# Patient Record
Sex: Female | Born: 1960 | Race: White | Hispanic: No | Marital: Married | Smoking: Never smoker
Health system: Southern US, Community
[De-identification: ages and names within clinical notes are randomized; demographics above are authoritative.]

## PROBLEM LIST (undated history)

## (undated) DIAGNOSIS — I10 Essential (primary) hypertension: Secondary | ICD-10-CM

## (undated) DIAGNOSIS — O24419 Gestational diabetes mellitus in pregnancy, unspecified control: Secondary | ICD-10-CM

## (undated) HISTORY — PX: CHOLECYSTECTOMY: SHX55

## (undated) HISTORY — DX: Essential (primary) hypertension: I10

## (undated) HISTORY — DX: Morbid (severe) obesity due to excess calories: E66.01

## (undated) HISTORY — DX: Gestational diabetes mellitus in pregnancy, unspecified control: O24.419

## (undated) HISTORY — PX: TOTAL ABDOMINAL HYSTERECTOMY: SHX209

## (undated) HISTORY — PX: ANTERIOR (CYSTOCELE) AND POSTERIOR REPAIR (RECTOCELE) WITH XENFORM GRAFT AND SACROSPINOUS FIXATION: SHX6492

---

## 2019-10-04 ENCOUNTER — Ambulatory Visit: Payer: Self-pay | Admitting: Internal Medicine

## 2019-10-04 ENCOUNTER — Other Ambulatory Visit: Payer: Self-pay

## 2019-10-05 ENCOUNTER — Ambulatory Visit: Payer: BC Managed Care – PPO | Admitting: Internal Medicine

## 2019-10-05 ENCOUNTER — Encounter: Payer: Self-pay | Admitting: Internal Medicine

## 2019-10-05 ENCOUNTER — Other Ambulatory Visit: Payer: Self-pay | Admitting: Internal Medicine

## 2019-10-05 VITALS — BP 130/80 | HR 74 | Temp 97.3°F | Ht 60.0 in | Wt 200.4 lb

## 2019-10-05 DIAGNOSIS — Z23 Encounter for immunization: Secondary | ICD-10-CM | POA: Diagnosis not present

## 2019-10-05 DIAGNOSIS — O24419 Gestational diabetes mellitus in pregnancy, unspecified control: Secondary | ICD-10-CM | POA: Diagnosis not present

## 2019-10-05 DIAGNOSIS — R252 Cramp and spasm: Secondary | ICD-10-CM | POA: Diagnosis not present

## 2019-10-05 DIAGNOSIS — Z1211 Encounter for screening for malignant neoplasm of colon: Secondary | ICD-10-CM | POA: Diagnosis not present

## 2019-10-05 DIAGNOSIS — E559 Vitamin D deficiency, unspecified: Secondary | ICD-10-CM

## 2019-10-05 DIAGNOSIS — I10 Essential (primary) hypertension: Secondary | ICD-10-CM | POA: Diagnosis not present

## 2019-10-05 DIAGNOSIS — Z1239 Encounter for other screening for malignant neoplasm of breast: Secondary | ICD-10-CM

## 2019-10-05 LAB — BASIC METABOLIC PANEL
BUN: 16 mg/dL (ref 6–23)
CO2: 29 mEq/L (ref 19–32)
Calcium: 9.7 mg/dL (ref 8.4–10.5)
Chloride: 102 mEq/L (ref 96–112)
Creatinine, Ser: 0.73 mg/dL (ref 0.40–1.20)
GFR: 82.1 mL/min (ref >60.00)
Glucose, Bld: 93 mg/dL (ref 70–99)
Potassium: 4.6 mEq/L (ref 3.5–5.1)
Sodium: 137 mEq/L (ref 135–145)

## 2019-10-05 LAB — HEMOGLOBIN A1C: Hgb A1c MFr Bld: 5.2 % (ref 4.6–6.5)

## 2019-10-05 LAB — VITAMIN D 25 HYDROXY (VIT D DEFICIENCY, FRACTURES): VITD: 20.19 ng/mL — ABNORMAL LOW (ref 30.00–100.00)

## 2019-10-05 LAB — VITAMIN B12: Vitamin B-12: 332 pg/mL (ref 211–911)

## 2019-10-05 LAB — TSH: TSH: 2.01 u[IU]/mL (ref 0.35–4.50)

## 2019-10-05 MED ORDER — VITAMIN D (ERGOCALCIFEROL) 1.25 MG (50000 UNIT) PO CAPS: 50000.0000 [IU] | ORAL_CAPSULE | ORAL | 0 refills | Status: AC

## 2019-10-05 NOTE — Patient Instructions (Signed)
-  Nice seeing you today!!  -Lab work today; will notify you once results are available.  -Flu, tetanus and first shingles vaccine today.  -Mammogram and GI referral requested.  -Schedule follow up in around 3 months for your physical. Please come in fasting that day.

## 2019-10-05 NOTE — Progress Notes (Signed)
New Patient Office Visit     This visit occurred during the SARS-CoV-2 public health emergency.  Safety protocols were in place, including screening questions prior to the visit, additional usage of staff PPE, and extensive cleaning of exam room while observing appropriate contact time as indicated for disinfecting solutions.    CC/Reason for Visit: Establish care, discuss chronic medical conditions, discuss medical concern Previous PCP: In [place] Last Visit: 2019  HPI: Kayla Fisher is a 58 y.o. female who is coming in today for the above mentioned reasons. Past Medical History is significant for: Hypertension that has been well controlled on 10 mg of benazepril, obesity.  She moved recently from [place] to be closer to family.  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  She is married, has 3 adult children and 2 grandchildren.  Her past surgical history significant for total abdominal hysterectomy, cystocele/rectocele repair, she has had a cholecystectomy and had a right carpal tunnel release in the past year.  She has no known drug allergies, is a never smoker and drinks alcohol only occasionally.  Her family history is significant for a mother with ovarian cancer as well as a sister with ovarian cancer she was tested negative for the BRCA gene.  She has another sister with sick sinus syndrome and a pacemaker, her father had diabetes and Graves' disease that caused a cardiomyopathy and atrial fibrillation.  She has been complaining of leg cramps that are severe at times.  She is requesting flu, Tdap and shingles vaccination.  She is also due for mammogram and colonoscopy for cancer screening.   Past Medical/Surgical History: Past Medical History:  Diagnosis Date  . Gestational diabetes mellitus (GDM)   . Hypertension   . Morbid obesity ([place])     Past Surgical History:  Procedure Laterality Date  . ANTERIOR (CYSTOCELE) AND POSTERIOR REPAIR (RECTOCELE) WITH  XENFORM GRAFT AND SACROSPINOUS FIXATION    . CHOLECYSTECTOMY    . TOTAL ABDOMINAL HYSTERECTOMY      Social History:  reports that she has never smoked. She has never used smokeless tobacco. She reports current alcohol use. She reports that she does not use drugs.  Allergies: No Known Allergies  Family History:  Family History  Problem Relation Age of Onset  . Ovarian cancer Mother   . Diabetes Father   . Graves' disease Father   . Atrial fibrillation Father   . Ovarian cancer Sister   . Sick sinus syndrome Sister      Current Outpatient Medications:  .  benazepril (LOTENSIN) 10 MG tablet, Take 10 mg by mouth daily., Disp: , Rfl:  .  glucosamine-chondroitin 500-400 MG tablet, Take 1 tablet by mouth daily., Disp: , Rfl:  .  Multiple Vitamin (MULTIVITAMIN) tablet, Take 1 tablet by mouth daily., Disp: , Rfl:  .  Omega-3 Fatty Acids (FISH OIL) 1000 MG CAPS, Take by mouth., Disp: , Rfl:   Review of Systems:  Constitutional: Denies fever, chills, diaphoresis, appetite change and fatigue.  HEENT: Denies photophobia, eye pain, redness, hearing loss, ear pain, congestion, sore throat, rhinorrhea, sneezing, mouth sores, trouble swallowing, neck pain, neck stiffness and tinnitus.   Respiratory: Denies SOB, DOE, cough, chest tightness,  and wheezing.   Cardiovascular: Denies chest pain, palpitations and leg swelling.  Gastrointestinal: Denies nausea, vomiting, abdominal pain, diarrhea, constipation, blood in stool and abdominal distention.  Genitourinary: Denies dysuria, urgency, frequency, hematuria, flank pain and difficulty urinating.  Endocrine: Denies: hot or cold intolerance, sweats, changes in  hair or nails, polyuria, polydipsia. Musculoskeletal: Denies myalgias, back pain, joint swelling, arthralgias and gait problem.  Skin: Denies pallor, rash and wound.  Neurological: Denies dizziness, seizures, syncope, weakness, light-headedness, numbness and headaches.  Hematological: Denies  adenopathy. Easy bruising, personal or family bleeding history  Psychiatric/Behavioral: Denies suicidal ideation, mood changes, confusion, nervousness, sleep disturbance and agitation    Physical Exam: Vitals:   10/05/19 1322  BP: 130/80  Pulse: 74  Temp: (!) 97.3 F (36.3 C)  TempSrc: Temporal  SpO2: 96%  Weight: 200 lb 6.4 oz (90.9 kg)  Height: 5' (1.524 m)   Body mass index is 39.14 kg/m.   Constitutional: NAD, calm, comfortable Eyes: PERRL, lids and conjunctivae normal ENMT: Mucous membranes are moist.  Respiratory: clear to auscultation bilaterally, no wheezing, no crackles. Normal respiratory effort. No accessory muscle use.  Cardiovascular: Regular rate and rhythm, no murmurs / rubs / gallops. No extremity edema. 2+ pedal pulses.  Abdomen: no tenderness, no masses palpated. No hepatosplenomegaly. Bowel sounds positive.  Musculoskeletal: no clubbing / cyanosis. No joint deformity upper and lower extremities. Good ROM, no contractures. Normal muscle tone.  Skin: no rashes, lesions, ulcers. No induration Neurologic: Grossly intact and nonfocal Psychiatric: Normal judgment and insight. Alert and oriented x 3. Normal mood.    Impression and Plan:  Leg cramps  - Plan: Basic metabolic panel, Hemoglobin A1c, TSH, Vitamin B12, VITAMIN D 25 Hydroxy (Vit-D Deficiency, Fractures), VITAMIN D 25 Hydroxy (Vit-D Deficiency, Fractures), Vitamin B12, TSH, Hemoglobin G8Z, Basic metabolic panel  Essential hypertension -Well-controlled on benazepril.  Morbid obesity ([place]) -Discussed healthy lifestyle, including increased physical activity and better food choices to promote weight loss.  Gestational diabetes mellitus (GDM), antepartum, gestational diabetes method of control unspecified -She is concerned that she might be developing diabetes due to her leg cramps, will add A1c to labs today.  She has had gestational diabetes in 2 out of her 3 pregnancies.  Screening for malignant  neoplasm of colon  - Plan: Ambulatory referral to Gastroenterology  Encounter for screening for malignant neoplasm of breast, unspecified screening modality  - Plan: MM Digital Screening     Patient Instructions  -Nice seeing you today!!  -Lab work today; will notify you once results are available.  -Flu, tetanus and first shingles vaccine today.  -Mammogram and GI referral requested.  -Schedule follow up in around 3 months for your physical. Please come in fasting that day.     Lelon Frohlich, MD  Primary Care at [facility]

## 2019-10-05 NOTE — Addendum Note (Signed)
Addended by: Westley Hummer B on: 10/05/2019 05:49 PM   Modules accepted: Orders

## 2019-10-28 ENCOUNTER — Other Ambulatory Visit: Payer: Self-pay | Admitting: Internal Medicine

## 2019-10-28 MED ORDER — BENAZEPRIL HCL 10 MG PO TABS: 10.0000 mg | ORAL_TABLET | Freq: Every day | ORAL | 0 refills | Status: DC

## 2019-10-28 NOTE — Telephone Encounter (Signed)
Copied from CRM [id number]. Topic: Quick Communication - Rx Refill/Question >> Oct 28, 2019 10:24 AM Jaquita Rector A wrote: Medication: benazepril (LOTENSIN) 10 MG tablet    Has the patient contacted their pharmacy? Yes.   (Agent: If no, request that the patient contact the pharmacy for the refill.) (Agent: If yes, when and what did the pharmacy advise?)  Preferred Pharmacy (with phone number or street name): [id number] [place], [place] - [address]  Phone:  [phone] Fax:  [phone]     Agent: Please be advised that RX refills may take up to 3 business days. We ask that you follow-up with your pharmacy.

## 2019-12-05 ENCOUNTER — Encounter: Payer: Self-pay | Admitting: Internal Medicine

## 2020-01-03 ENCOUNTER — Encounter: Payer: BC Managed Care – PPO | Admitting: Internal Medicine

## 2020-01-24 ENCOUNTER — Other Ambulatory Visit: Payer: Self-pay | Admitting: Internal Medicine

## 2020-02-23 ENCOUNTER — Other Ambulatory Visit: Payer: Self-pay

## 2020-02-24 ENCOUNTER — Encounter: Payer: Self-pay | Admitting: Internal Medicine

## 2020-02-24 ENCOUNTER — Encounter: Payer: Self-pay | Admitting: Gastroenterology

## 2020-02-24 ENCOUNTER — Ambulatory Visit (INDEPENDENT_AMBULATORY_CARE_PROVIDER_SITE_OTHER): Payer: BC Managed Care – PPO | Admitting: Internal Medicine

## 2020-02-24 ENCOUNTER — Other Ambulatory Visit: Payer: Self-pay | Admitting: Internal Medicine

## 2020-02-24 VITALS — BP 110/80 | HR 80 | Temp 97.7°F | Ht 60.0 in | Wt 202.6 lb

## 2020-02-24 DIAGNOSIS — E559 Vitamin D deficiency, unspecified: Secondary | ICD-10-CM

## 2020-02-24 DIAGNOSIS — Z1231 Encounter for screening mammogram for malignant neoplasm of breast: Secondary | ICD-10-CM

## 2020-02-24 DIAGNOSIS — E785 Hyperlipidemia, unspecified: Secondary | ICD-10-CM | POA: Insufficient documentation

## 2020-02-24 DIAGNOSIS — I10 Essential (primary) hypertension: Secondary | ICD-10-CM | POA: Diagnosis not present

## 2020-02-24 DIAGNOSIS — Z1211 Encounter for screening for malignant neoplasm of colon: Secondary | ICD-10-CM

## 2020-02-24 DIAGNOSIS — Z0000 Encounter for general adult medical examination without abnormal findings: Secondary | ICD-10-CM

## 2020-02-24 LAB — COMPREHENSIVE METABOLIC PANEL
ALT: 16 U/L (ref 0–35)
AST: 15 U/L (ref 0–37)
Albumin: 4.1 g/dL (ref 3.5–5.2)
Alkaline Phosphatase: 95 U/L (ref 39–117)
BUN: 21 mg/dL (ref 6–23)
CO2: 29 mEq/L (ref 19–32)
Calcium: 9.2 mg/dL (ref 8.4–10.5)
Chloride: 104 mEq/L (ref 96–112)
Creatinine, Ser: 0.76 mg/dL (ref 0.40–1.20)
GFR: 78.26 mL/min (ref >60.00)
Glucose, Bld: 93 mg/dL (ref 70–99)
Potassium: 5 mEq/L (ref 3.5–5.1)
Sodium: 139 mEq/L (ref 135–145)
Total Bilirubin: 0.3 mg/dL (ref 0.2–1.2)
Total Protein: 7.5 g/dL (ref 6.0–8.3)

## 2020-02-24 LAB — CBC WITH DIFFERENTIAL/PLATELET
Basophils Absolute: 0.1 10*3/uL (ref 0.0–0.1)
Basophils Relative: 0.6 % (ref 0.0–3.0)
Eosinophils Absolute: 0.5 10*3/uL (ref 0.0–0.7)
Eosinophils Relative: 6 % — ABNORMAL HIGH (ref 0.0–5.0)
HCT: 39.3 % (ref 36.0–46.0)
Hemoglobin: 13.4 g/dL (ref 12.0–15.0)
Lymphocytes Relative: 24.2 % (ref 12.0–46.0)
Lymphs Abs: 2.2 10*3/uL (ref 0.7–4.0)
MCHC: 34.2 g/dL (ref 30.0–36.0)
MCV: 93.7 fl (ref 78.0–100.0)
Monocytes Absolute: 0.7 10*3/uL (ref 0.1–1.0)
Monocytes Relative: 7.8 % (ref 3.0–12.0)
Neutro Abs: 5.6 10*3/uL (ref 1.4–7.7)
Neutrophils Relative %: 61.4 % (ref 43.0–77.0)
Platelets: 292 10*3/uL (ref 150.0–400.0)
RBC: 4.2 Mil/uL (ref 3.87–5.11)
RDW: 13 % (ref 11.5–15.5)
WBC: 9.1 10*3/uL (ref 4.0–10.5)

## 2020-02-24 LAB — LIPID PANEL
Cholesterol: 203 mg/dL — ABNORMAL HIGH (ref 0–200)
HDL: 49.7 mg/dL (ref >39.00)
LDL Cholesterol: 130 mg/dL — ABNORMAL HIGH (ref 0–99)
NonHDL: 153.15
Total CHOL/HDL Ratio: 4
Triglycerides: 115 mg/dL (ref 0.0–149.0)
VLDL: 23 mg/dL (ref 0.0–40.0)

## 2020-02-24 LAB — HEMOGLOBIN A1C: Hgb A1c MFr Bld: 5.2 % (ref 4.6–6.5)

## 2020-02-24 LAB — VITAMIN D 25 HYDROXY (VIT D DEFICIENCY, FRACTURES): VITD: 29.11 ng/mL — ABNORMAL LOW (ref 30.00–100.00)

## 2020-02-24 MED ORDER — BENAZEPRIL HCL 10 MG PO TABS: 10.0000 mg | ORAL_TABLET | Freq: Every day | ORAL | 1 refills | Status: DC

## 2020-02-24 MED ORDER — VITAMIN D (ERGOCALCIFEROL) 1.25 MG (50000 UNIT) PO CAPS: 50000.0000 [IU] | ORAL_CAPSULE | ORAL | 0 refills | Status: AC

## 2020-02-24 NOTE — Addendum Note (Signed)
Addended by: Bonnye Fava on: 02/24/2020 07:29 AM   Modules accepted: Orders

## 2020-02-24 NOTE — Progress Notes (Signed)
Established Patient Office Visit     This visit occurred during the SARS-CoV-2 public health emergency.  Safety protocols were in place, including screening questions prior to the visit, additional usage of staff PPE, and extensive cleaning of exam room while observing appropriate contact time as indicated for disinfecting solutions.    CC/Reason for Visit: Annual preventive exam  HPI: Kayla Fisher is a 59 y.o. female who is coming in today for the above mentioned reasons. Past Medical History is significant for: Morbid obesity and hypertension that is well controlled on benazepril.  She has no acute complaints today.  She does wear hearing aids.  She is overdue for mammogram and colonoscopy.  She has had a complete hysterectomy and was told she no longer needs Pap smears.  She has routine eye and dental care.  She does not do much in the way of exercising.  She had both of her Covid vaccines.  She is due for her second shingles vaccine.   Past Medical/Surgical History: Past Medical History:  Diagnosis Date  . Gestational diabetes mellitus (GDM)   . Hypertension   . Morbid obesity ([place])     Past Surgical History:  Procedure Laterality Date  . ANTERIOR (CYSTOCELE) AND POSTERIOR REPAIR (RECTOCELE) WITH XENFORM GRAFT AND SACROSPINOUS FIXATION    . CHOLECYSTECTOMY    . TOTAL ABDOMINAL HYSTERECTOMY      Social History:  reports that she has never smoked. She has never used smokeless tobacco. She reports current alcohol use. She reports that she does not use drugs.  Allergies: No Known Allergies  Family History:  Family History  Problem Relation Age of Onset  . Ovarian cancer Mother   . Diabetes Father   . Graves' disease Father   . Atrial fibrillation Father   . Ovarian cancer Sister   . Sick sinus syndrome Sister      Current Outpatient Medications:  .  benazepril (LOTENSIN) 10 MG tablet, Take 1 tablet (10 mg total) by mouth daily., Disp: 90 tablet, Rfl:  1 .  glucosamine-chondroitin 500-400 MG tablet, Take 1 tablet by mouth daily., Disp: , Rfl:  .  Multiple Vitamin (MULTIVITAMIN) tablet, Take 1 tablet by mouth daily., Disp: , Rfl:  .  Omega-3 Fatty Acids (FISH OIL) 1000 MG CAPS, Take by mouth., Disp: , Rfl:   Review of Systems:  Constitutional: Denies fever, chills, diaphoresis, appetite change and fatigue.  HEENT: Denies photophobia, eye pain, redness, hearing loss, ear pain, congestion, sore throat, rhinorrhea, sneezing, mouth sores, trouble swallowing, neck pain, neck stiffness and tinnitus.   Respiratory: Denies SOB, DOE, cough, chest tightness,  and wheezing.   Cardiovascular: Denies chest pain, palpitations and leg swelling.  Gastrointestinal: Denies nausea, vomiting, abdominal pain, diarrhea, constipation, blood in stool and abdominal distention.  Genitourinary: Denies dysuria, urgency, frequency, hematuria, flank pain and difficulty urinating.  Endocrine: Denies: hot or cold intolerance, sweats, changes in hair or nails, polyuria, polydipsia. Musculoskeletal: Denies myalgias, back pain, joint swelling, arthralgias and gait problem.  Skin: Denies pallor, rash and wound.  Neurological: Denies dizziness, seizures, syncope, weakness, light-headedness, numbness and headaches.  Hematological: Denies adenopathy. Easy bruising, personal or family bleeding history  Psychiatric/Behavioral: Denies suicidal ideation, mood changes, confusion, nervousness, sleep disturbance and agitation    Physical Exam: Vitals:   02/24/20 0701  BP: 110/80  Pulse: 80  Temp: 97.7 F (36.5 C)  TempSrc: Temporal  SpO2: 97%  Weight: 202 lb 9.6 oz (91.9 kg)  Height: 5' (1.524 m)  Body mass index is 39.57 kg/m.   Constitutional: NAD, calm, comfortable Eyes: PERRL, lids and conjunctivae normal, wears corrective lenses ENMT: Mucous membranes are moist.Tympanic membrane is pearly white, no erythema or bulging. Neck: normal, supple, no masses, no  thyromegaly Respiratory: clear to auscultation bilaterally, no wheezing, no crackles. Normal respiratory effort. No accessory muscle use.  Cardiovascular: Regular rate and rhythm, no murmurs / rubs / gallops. No extremity edema. 2+ pedal pulses. Abdomen: no tenderness, no masses palpated. No hepatosplenomegaly. Bowel sounds positive.  Musculoskeletal: no clubbing / cyanosis. No joint deformity upper and lower extremities. Good ROM, no contractures. Normal muscle tone.  Skin: no rashes, lesions, ulcers. No induration Neurologic: CN 2-12 grossly intact. Sensation intact, DTR normal. Strength 5/5 in all 4.  Psychiatric: Normal judgment and insight. Alert and oriented x 3. Normal mood.     Impression and Plan:  Screening for malignant neoplasm of colon  - Plan: Ambulatory referral to Gastroenterology  Encounter for preventive health examination  -Have advised routine eye and dental care. -She is due for her second shingles vaccine but will defer for now she just had her second Covid vaccine.  Otherwise immunizations are up-to-date. -Screening labs today. -Healthy lifestyle discussed in detail. -Overdue for mammogram and colonoscopy, will order. -She was told she no longer needed Pap smears as she has had a complete hysterectomy.  Morbid obesity ([place]) -Discussed healthy lifestyle, including increased physical activity and better food choices to promote weight loss.  Essential hypertension  -Well-controlled on benazepril 10 mg.  Vitamin D deficiency  - Plan: VITAMIN D 25 Hydroxy (Vit-D Deficiency, Fractures)    Patient Instructions  -Nice seeing you today!!  -Lab work today; will notify you once results are available.  -Will schedule mammogram and GI referral for colonoscopy.  -Remember to schedule follow up for your shingles vaccine 6 weeks after second COVID vaccine.  -Schedule follow up in 6 months.   Preventive Care 33-69 Years Old, Female Preventive care refers to  visits with your health care provider and lifestyle choices that can promote health and wellness. This includes:  A yearly physical exam. This may also be called an annual well check.  Regular dental visits and eye exams.  Immunizations.  Screening for certain conditions.  Healthy lifestyle choices, such as eating a healthy diet, getting regular exercise, not using drugs or products that contain nicotine and tobacco, and limiting alcohol use. What can I expect for my preventive care visit? Physical exam Your health care provider will check your:  Height and weight. This may be used to calculate body mass index (BMI), which tells if you are at a healthy weight.  Heart rate and blood pressure.  Skin for abnormal spots. Counseling Your health care provider may ask you questions about your:  Alcohol, tobacco, and drug use.  Emotional well-being.  Home and relationship well-being.  Sexual activity.  Eating habits.  Work and work Statistician.  Method of birth control.  Menstrual cycle.  Pregnancy history. What immunizations do I need?  Influenza (flu) vaccine  This is recommended every year. Tetanus, diphtheria, and pertussis (Tdap) vaccine  You may need a Td booster every 10 years. Varicella (chickenpox) vaccine  You may need this if you have not been vaccinated. Zoster (shingles) vaccine  You may need this after age 40. Measles, mumps, and rubella (MMR) vaccine  You may need at least one dose of MMR if you were born in 1957 or later. You may also need a second dose. Pneumococcal  conjugate (PCV13) vaccine  You may need this if you have certain conditions and were not previously vaccinated. Pneumococcal polysaccharide (PPSV23) vaccine  You may need one or two doses if you smoke cigarettes or if you have certain conditions. Meningococcal conjugate (MenACWY) vaccine  You may need this if you have certain conditions. Hepatitis A vaccine  You may need this if  you have certain conditions or if you travel or work in places where you may be exposed to hepatitis A. Hepatitis B vaccine  You may need this if you have certain conditions or if you travel or work in places where you may be exposed to hepatitis B. Haemophilus influenzae type b (Hib) vaccine  You may need this if you have certain conditions. Human papillomavirus (HPV) vaccine  If recommended by your health care provider, you may need three doses over 6 months. You may receive vaccines as individual doses or as more than one vaccine together in one shot (combination vaccines). Talk with your health care provider about the risks and benefits of combination vaccines. What tests do I need? Blood tests  Lipid and cholesterol levels. These may be checked every 5 years, or more frequently if you are over 84 years old.  Hepatitis C test.  Hepatitis B test. Screening  Lung cancer screening. You may have this screening every year starting at age 43 if you have a 30-pack-year history of smoking and currently smoke or have quit within the past 15 years.  Colorectal cancer screening. All adults should have this screening starting at age 64 and continuing until age 69. Your health care provider may recommend screening at age 21 if you are at increased risk. You will have tests every 1-10 years, depending on your results and the type of screening test.  Diabetes screening. This is done by checking your blood sugar (glucose) after you have not eaten for a while (fasting). You may have this done every 1-3 years.  Mammogram. This may be done every 1-2 years. Talk with your health care provider about when you should start having regular mammograms. This may depend on whether you have a family history of breast cancer.  BRCA-related cancer screening. This may be done if you have a family history of breast, ovarian, tubal, or peritoneal cancers.  Pelvic exam and Pap test. This may be done every 3 years  starting at age 43. Starting at age 87, this may be done every 5 years if you have a Pap test in combination with an HPV test. Other tests  Sexually transmitted disease (STD) testing.  Bone density scan. This is done to screen for osteoporosis. You may have this scan if you are at high risk for osteoporosis. Follow these instructions at home: Eating and drinking  Eat a diet that includes fresh fruits and vegetables, whole grains, lean protein, and low-fat dairy.  Take vitamin and mineral supplements as recommended by your health care provider.  Do not drink alcohol if: ? Your health care provider tells you not to drink. ? You are pregnant, may be pregnant, or are planning to become pregnant.  If you drink alcohol: ? Limit how much you have to 0-1 drink a day. ? Be aware of how much alcohol is in your drink. In the [place] one drink equals one 12 oz bottle of beer (355 mL), one 5 oz glass of wine (148 mL), or one 1 oz glass of hard liquor (44 mL). Lifestyle  Take daily care of your teeth and  gums.  Stay active. Exercise for at least 30 minutes on 5 or more days each week.  Do not use any products that contain nicotine or tobacco, such as cigarettes, e-cigarettes, and chewing tobacco. If you need help quitting, ask your health care provider.  If you are sexually active, practice safe sex. Use a condom or other form of birth control (contraception) in order to prevent pregnancy and STIs (sexually transmitted infections).  If told by your health care provider, take low-dose aspirin daily starting at age 7. What's next?  Visit your health care provider once a year for a well check visit.  Ask your health care provider how often you should have your eyes and teeth checked.  Stay up to date on all vaccines. This information is not intended to replace advice given to you by your health care provider. Make sure you discuss any questions you have with your health care provider. Document  Revised: 06/17/2018 Document Reviewed: 06/17/2018 Elsevier Patient Education  2020 [place], MD [facility]

## 2020-02-24 NOTE — Patient Instructions (Signed)
-Nice seeing you today!!  -Lab work today; will notify you once results are available.  -Will schedule mammogram and GI referral for colonoscopy.  -Remember to schedule follow up for your shingles vaccine 6 weeks after second COVID vaccine.  -Schedule follow up in 6 months.   Preventive Care 47-58 Years Old, Female Preventive care refers to visits with your health care provider and lifestyle choices that can promote health and wellness. This includes:  A yearly physical exam. This may also be called an annual well check.  Regular dental visits and eye exams.  Immunizations.  Screening for certain conditions.  Healthy lifestyle choices, such as eating a healthy diet, getting regular exercise, not using drugs or products that contain nicotine and tobacco, and limiting alcohol use. What can I expect for my preventive care visit? Physical exam Your health care provider will check your:  Height and weight. This may be used to calculate body mass index (BMI), which tells if you are at a healthy weight.  Heart rate and blood pressure.  Skin for abnormal spots. Counseling Your health care provider may ask you questions about your:  Alcohol, tobacco, and drug use.  Emotional well-being.  Home and relationship well-being.  Sexual activity.  Eating habits.  Work and work Statistician.  Method of birth control.  Menstrual cycle.  Pregnancy history. What immunizations do I need?  Influenza (flu) vaccine  This is recommended every year. Tetanus, diphtheria, and pertussis (Tdap) vaccine  You may need a Td booster every 10 years. Varicella (chickenpox) vaccine  You may need this if you have not been vaccinated. Zoster (shingles) vaccine  You may need this after age 10. Measles, mumps, and rubella (MMR) vaccine  You may need at least one dose of MMR if you were born in 1957 or later. You may also need a second dose. Pneumococcal conjugate (PCV13) vaccine  You may  need this if you have certain conditions and were not previously vaccinated. Pneumococcal polysaccharide (PPSV23) vaccine  You may need one or two doses if you smoke cigarettes or if you have certain conditions. Meningococcal conjugate (MenACWY) vaccine  You may need this if you have certain conditions. Hepatitis A vaccine  You may need this if you have certain conditions or if you travel or work in places where you may be exposed to hepatitis A. Hepatitis B vaccine  You may need this if you have certain conditions or if you travel or work in places where you may be exposed to hepatitis B. Haemophilus influenzae type b (Hib) vaccine  You may need this if you have certain conditions. Human papillomavirus (HPV) vaccine  If recommended by your health care provider, you may need three doses over 6 months. You may receive vaccines as individual doses or as more than one vaccine together in one shot (combination vaccines). Talk with your health care provider about the risks and benefits of combination vaccines. What tests do I need? Blood tests  Lipid and cholesterol levels. These may be checked every 5 years, or more frequently if you are over 32 years old.  Hepatitis C test.  Hepatitis B test. Screening  Lung cancer screening. You may have this screening every year starting at age 35 if you have a 30-pack-year history of smoking and currently smoke or have quit within the past 15 years.  Colorectal cancer screening. All adults should have this screening starting at age 35 and continuing until age 31. Your health care provider may recommend screening at age  45 if you are at increased risk. You will have tests every 1-10 years, depending on your results and the type of screening test.  Diabetes screening. This is done by checking your blood sugar (glucose) after you have not eaten for a while (fasting). You may have this done every 1-3 years.  Mammogram. This may be done every 1-2  years. Talk with your health care provider about when you should start having regular mammograms. This may depend on whether you have a family history of breast cancer.  BRCA-related cancer screening. This may be done if you have a family history of breast, ovarian, tubal, or peritoneal cancers.  Pelvic exam and Pap test. This may be done every 3 years starting at age 5. Starting at age 22, this may be done every 5 years if you have a Pap test in combination with an HPV test. Other tests  Sexually transmitted disease (STD) testing.  Bone density scan. This is done to screen for osteoporosis. You may have this scan if you are at high risk for osteoporosis. Follow these instructions at home: Eating and drinking  Eat a diet that includes fresh fruits and vegetables, whole grains, lean protein, and low-fat dairy.  Take vitamin and mineral supplements as recommended by your health care provider.  Do not drink alcohol if: ? Your health care provider tells you not to drink. ? You are pregnant, may be pregnant, or are planning to become pregnant.  If you drink alcohol: ? Limit how much you have to 0-1 drink a day. ? Be aware of how much alcohol is in your drink. In the [place] one drink equals one 12 oz bottle of beer (355 mL), one 5 oz glass of wine (148 mL), or one 1 oz glass of hard liquor (44 mL). Lifestyle  Take daily care of your teeth and gums.  Stay active. Exercise for at least 30 minutes on 5 or more days each week.  Do not use any products that contain nicotine or tobacco, such as cigarettes, e-cigarettes, and chewing tobacco. If you need help quitting, ask your health care provider.  If you are sexually active, practice safe sex. Use a condom or other form of birth control (contraception) in order to prevent pregnancy and STIs (sexually transmitted infections).  If told by your health care provider, take low-dose aspirin daily starting at age 15. What's next?  Visit your  health care provider once a year for a well check visit.  Ask your health care provider how often you should have your eyes and teeth checked.  Stay up to date on all vaccines. This information is not intended to replace advice given to you by your health care provider. Make sure you discuss any questions you have with your health care provider. Document Revised: 06/17/2018 Document Reviewed: 06/17/2018 Elsevier Patient Education  2020 [employer].

## 2020-02-28 ENCOUNTER — Other Ambulatory Visit: Payer: Self-pay | Admitting: Internal Medicine

## 2020-02-28 DIAGNOSIS — E559 Vitamin D deficiency, unspecified: Secondary | ICD-10-CM

## 2020-03-07 ENCOUNTER — Other Ambulatory Visit: Payer: Self-pay

## 2020-03-07 ENCOUNTER — Ambulatory Visit
Admission: RE | Admit: 2020-03-07 | Discharge: 2020-03-07 | Disposition: A | Discharge status: Home / Self Care | Payer: BC Managed Care – PPO | Source: Ambulatory Visit | Attending: Internal Medicine | Admitting: Internal Medicine

## 2020-03-07 DIAGNOSIS — Z1231 Encounter for screening mammogram for malignant neoplasm of breast: Secondary | ICD-10-CM | POA: Diagnosis not present

## 2020-03-20 ENCOUNTER — Ambulatory Visit: Payer: BC Managed Care – PPO | Admitting: Gastroenterology

## 2020-03-28 ENCOUNTER — Other Ambulatory Visit: Payer: Self-pay

## 2020-03-28 ENCOUNTER — Ambulatory Visit (INDEPENDENT_AMBULATORY_CARE_PROVIDER_SITE_OTHER): Payer: BC Managed Care – PPO

## 2020-03-28 DIAGNOSIS — Z23 Encounter for immunization: Secondary | ICD-10-CM

## 2020-03-28 NOTE — Progress Notes (Signed)
Per orders of Dr. Ardyth Harps, injection of shingles vaccine given by Sherrin Daisy. Patient tolerated injection well.

## 2020-05-07 NOTE — Addendum Note (Signed)
Addended by: Lerry Liner on: 05/07/2020 03:48 PM   Modules accepted: Orders

## 2020-05-12 ENCOUNTER — Other Ambulatory Visit: Payer: Self-pay | Admitting: Internal Medicine

## 2020-05-12 DIAGNOSIS — E559 Vitamin D deficiency, unspecified: Secondary | ICD-10-CM

## 2020-05-28 ENCOUNTER — Other Ambulatory Visit: Payer: Self-pay

## 2020-05-28 ENCOUNTER — Other Ambulatory Visit (INDEPENDENT_AMBULATORY_CARE_PROVIDER_SITE_OTHER): Payer: BC Managed Care – PPO

## 2020-05-28 DIAGNOSIS — E559 Vitamin D deficiency, unspecified: Secondary | ICD-10-CM

## 2020-05-28 LAB — VITAMIN D 25 HYDROXY (VIT D DEFICIENCY, FRACTURES): Vit D, 25-Hydroxy: 33 ng/mL (ref 30–100)

## 2020-05-29 ENCOUNTER — Other Ambulatory Visit: Payer: Self-pay | Admitting: Internal Medicine

## 2020-05-29 DIAGNOSIS — E559 Vitamin D deficiency, unspecified: Secondary | ICD-10-CM

## 2020-05-29 MED ORDER — VITAMIN D (ERGOCALCIFEROL) 1.25 MG (50000 UNIT) PO CAPS: 50000.0000 [IU] | ORAL_CAPSULE | ORAL | 0 refills | Status: DC

## 2020-08-16 ENCOUNTER — Other Ambulatory Visit: Payer: Self-pay | Admitting: Internal Medicine

## 2020-08-16 DIAGNOSIS — E559 Vitamin D deficiency, unspecified: Secondary | ICD-10-CM

## 2020-08-29 ENCOUNTER — Ambulatory Visit: Payer: BC Managed Care – PPO | Admitting: Internal Medicine

## 2020-10-02 ENCOUNTER — Other Ambulatory Visit: Payer: Self-pay | Admitting: Internal Medicine

## 2020-10-02 DIAGNOSIS — I10 Essential (primary) hypertension: Secondary | ICD-10-CM

## 2020-11-06 ENCOUNTER — Other Ambulatory Visit: Payer: Self-pay | Admitting: Internal Medicine

## 2020-11-06 DIAGNOSIS — E559 Vitamin D deficiency, unspecified: Secondary | ICD-10-CM

## 2021-03-21 ENCOUNTER — Encounter: Payer: Self-pay | Admitting: Internal Medicine

## 2021-03-21 MED ORDER — ALBUTEROL SULFATE HFA 108 (90 BASE) MCG/ACT IN AERS: 2.0000 | INHALATION_SPRAY | Freq: Four times a day (QID) | RESPIRATORY_TRACT | 0 refills | Status: DC | PRN

## 2021-03-31 ENCOUNTER — Other Ambulatory Visit: Payer: Self-pay | Admitting: Internal Medicine

## 2021-03-31 DIAGNOSIS — I10 Essential (primary) hypertension: Secondary | ICD-10-CM

## 2021-04-12 ENCOUNTER — Other Ambulatory Visit: Payer: Self-pay | Admitting: Internal Medicine

## 2021-06-09 IMAGING — MG DIGITAL SCREENING BILAT W/ CAD
5 series · 5 of 5 positions shown · non-contrast
Comparison: None.

ACR Breast Density Category a: The breast tissue is almost entirely
fatty.

CLINICAL DATA: Screening.

EXAM:
DIGITAL SCREENING BILATERAL MAMMOGRAM WITH CAD

[L MLO]
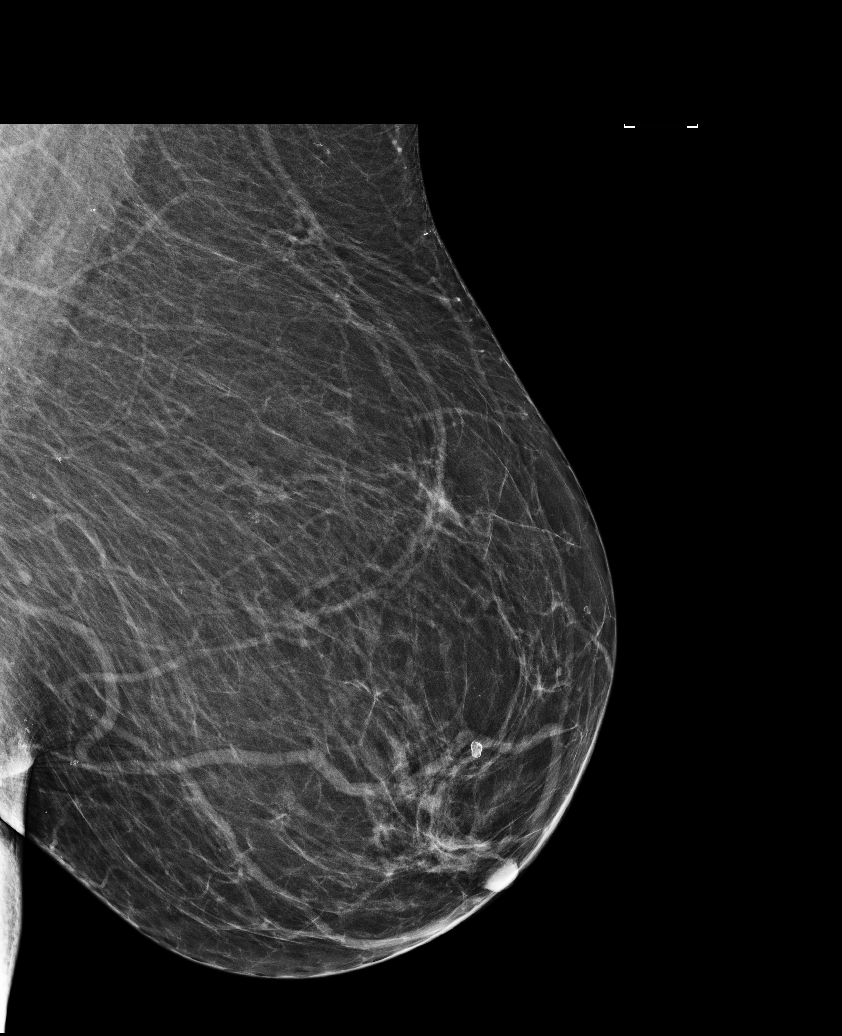

[R CC]
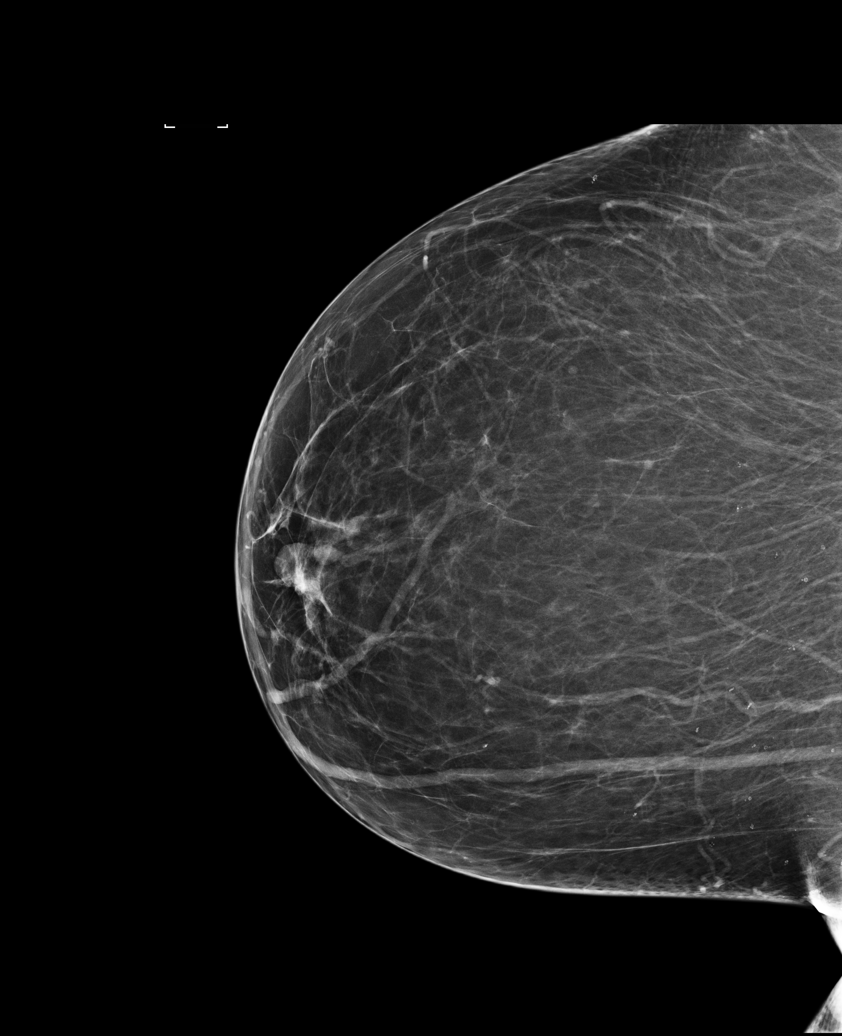

[R MLO]
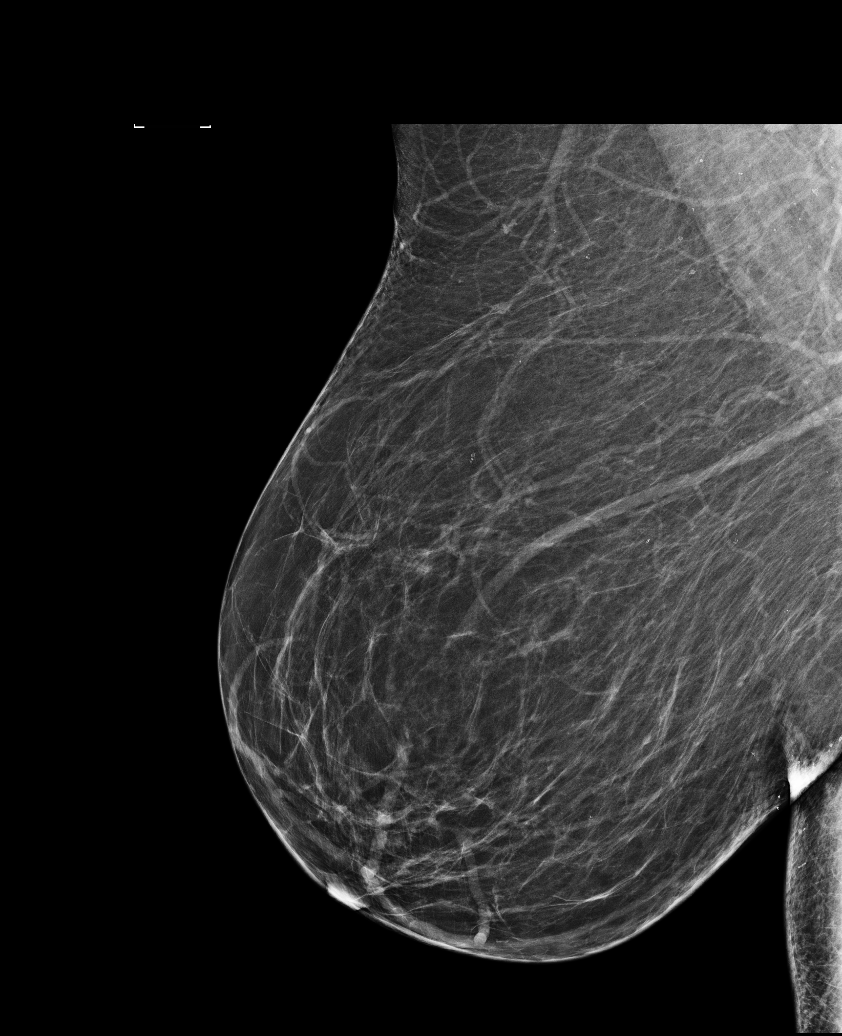

[L CC (1 of 2)]
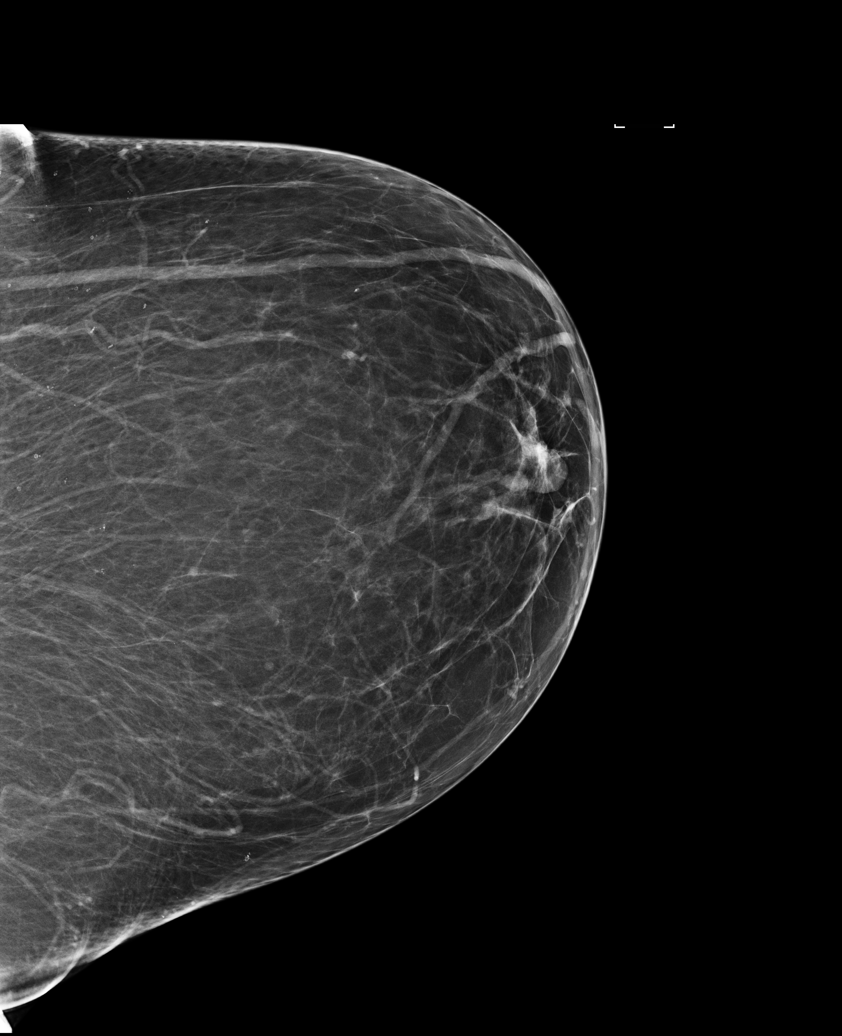

[L CC (2 of 2)]
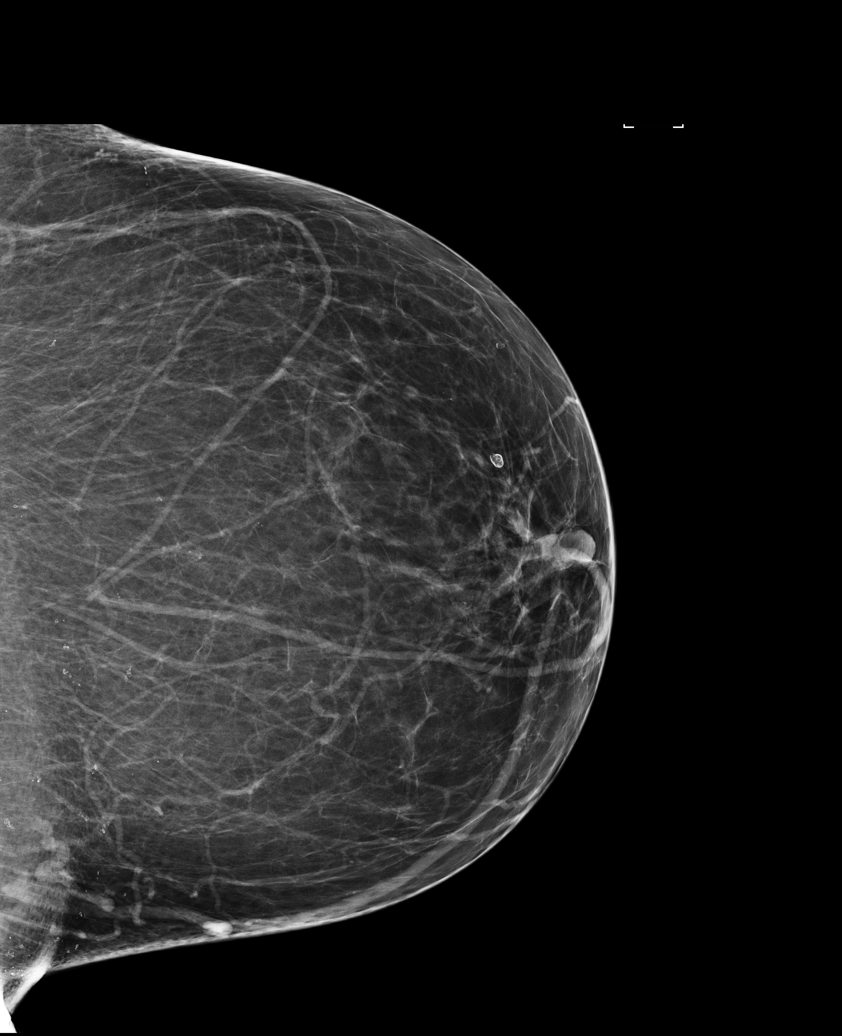

[5 of 5 positions shown; findings below may reference images not displayed]

FINDINGS: There are no findings suspicious for malignancy. Images were
processed with CAD.
IMPRESSION: No mammographic evidence of malignancy. A result letter of this
screening mammogram will be mailed directly to the patient.

RECOMMENDATION:
Screening mammogram in one year. (Code:[id number])

BI-RADS CATEGORY  1: Negative.

## 2021-09-06 ENCOUNTER — Encounter: Payer: Self-pay | Admitting: Internal Medicine

## 2021-09-06 ENCOUNTER — Ambulatory Visit: Payer: BC Managed Care – PPO | Admitting: Internal Medicine

## 2021-09-06 VITALS — BP 110/80 | HR 80 | Temp 97.9°F | Wt 161.4 lb

## 2021-09-06 DIAGNOSIS — I10 Essential (primary) hypertension: Secondary | ICD-10-CM | POA: Diagnosis not present

## 2021-09-06 DIAGNOSIS — R5383 Other fatigue: Secondary | ICD-10-CM | POA: Diagnosis not present

## 2021-09-06 DIAGNOSIS — Z23 Encounter for immunization: Secondary | ICD-10-CM | POA: Diagnosis not present

## 2021-09-06 DIAGNOSIS — E782 Mixed hyperlipidemia: Secondary | ICD-10-CM | POA: Diagnosis not present

## 2021-09-06 DIAGNOSIS — R079 Chest pain, unspecified: Secondary | ICD-10-CM

## 2021-09-06 DIAGNOSIS — R0602 Shortness of breath: Secondary | ICD-10-CM | POA: Diagnosis not present

## 2021-09-06 NOTE — Addendum Note (Signed)
Addended by: Marian Sorrow D on: 09/06/2021 02:40 PM   Modules accepted: Orders

## 2021-09-06 NOTE — Addendum Note (Signed)
Addended by: Kern Reap B on: 09/06/2021 04:23 PM   Modules accepted: Orders

## 2021-09-06 NOTE — Progress Notes (Signed)
Established Patient Office Visit     This visit occurred during the SARS-CoV-2 public health emergency.  Safety protocols were in place, including screening questions prior to the visit, additional usage of staff PPE, and extensive cleaning of exam room while observing appropriate contact time as indicated for disinfecting solutions.    CC/Reason for Visit: Discuss acute symptoms  HPI: Kayla Fisher is a 60 y.o. female who is coming in today for the above mentioned reasons. Past Medical History is significant for: Hypertension, hyperlipidemia, previous vitamin D deficiency.  She was morbidly obese but has lost 40 pounds since I last saw her in May 2021.  She is here today with a conglomeration of symptoms that has been concerning to her.  Mainly extreme fatigue, leg cramps, and what she describes as a sensation of her "heart hurting".  She feels sometimes like she "cannot catch a deep breath".  She feels a "pressure on her chest that makes it difficult to breathe".  This is not related to exertion.  Can sometimes happen when she is sitting down on the couch reading to her grandchildren or when she is doing housework.  No recent URI symptoms, no palpitations, no dizziness, no lightheadedness.  She quit taking her lisinopril back in February after losing 40 pounds she noticed that her systolics are dropping into the 90s and causing dizziness.   Past Medical/Surgical History: Past Medical History:  Diagnosis Date   Gestational diabetes mellitus (GDM)    Hypertension    Morbid obesity ([place])     Past Surgical History:  Procedure Laterality Date   ANTERIOR (CYSTOCELE) AND POSTERIOR REPAIR (RECTOCELE) WITH XENFORM GRAFT AND SACROSPINOUS FIXATION     CHOLECYSTECTOMY     TOTAL ABDOMINAL HYSTERECTOMY      Social History:  reports that she has never smoked. She has never used smokeless tobacco. She reports current alcohol use. She reports that she does not use  drugs.  Allergies: No Known Allergies  Family History:  Family History  Problem Relation Age of Onset   Ovarian cancer Mother    Diabetes Father    Berenice Primas' disease Father    Atrial fibrillation Father    Ovarian cancer Sister    Sick sinus syndrome Sister      Current Outpatient Medications:    benazepril (LOTENSIN) 10 MG tablet, TAKE 1 TABLET BY MOUTH EVERY DAY (Patient not taking: Reported on 09/06/2021), Disp: 90 tablet, Rfl: 0   Multiple Vitamin (MULTIVITAMIN) tablet, Take 1 tablet by mouth daily., Disp: , Rfl:   Review of Systems:  Constitutional: Denies fever, chills, diaphoresis, appetite change. HEENT: Denies photophobia, eye pain, redness, hearing loss, ear pain, congestion, sore throat, rhinorrhea, sneezing, mouth sores, trouble swallowing, neck pain, neck stiffness and tinnitus.   Respiratory: Denies  cough,  and wheezing.   Cardiovascular: Denies  palpitations and leg swelling.  Gastrointestinal: Denies nausea, vomiting, abdominal pain, diarrhea, constipation, blood in stool and abdominal distention.  Genitourinary: Denies dysuria, urgency, frequency, hematuria, flank pain and difficulty urinating.  Endocrine: Denies: hot or cold intolerance, sweats, changes in hair or nails, polyuria, polydipsia. Musculoskeletal: Denies myalgias, back pain, joint swelling, arthralgias and gait problem.  Skin: Denies pallor, rash and wound.  Neurological: Denies dizziness, seizures, syncope, weakness, light-headedness, numbness and headaches.  Hematological: Denies adenopathy. Easy bruising, personal or family bleeding history  Psychiatric/Behavioral: Denies suicidal ideation, mood changes, confusion, nervousness, sleep disturbance and agitation    Physical Exam: Vitals:   09/06/21 1355  BP: 110/80  Pulse: 80  Temp: 97.9 F (36.6 C)  TempSrc: Oral  SpO2: 94%  Weight: 161 lb 6.4 oz (73.2 kg)    Body mass index is 31.52 kg/m.   Constitutional: NAD, calm,  comfortable Eyes: PERRL, lids and conjunctivae normal, wears corrective lenses ENMT: Mucous membranes are moist.  Respiratory: clear to auscultation bilaterally, no wheezing, no crackles. Normal respiratory effort. No accessory muscle use.  Cardiovascular: Regular rate and rhythm, no murmurs / rubs / gallops. No extremity edema.  Neurologic: Grossly intact and nonfocal Psychiatric: Normal judgment and insight. Alert and oriented x 3. Normal mood.    Impression and Plan:  Chest pain, unspecified type - Plan: EKG 12-Lead, Ambulatory referral to Cardiology  SOB (shortness of breath) - Plan: EKG 12-Lead, Ambulatory referral to Cardiology  Mixed hyperlipidemia - Plan: Lipid panel  Primary hypertension - Plan: CBC with Differential/Platelet, Comprehensive metabolic panel  Morbid obesity (HCC)  Fatigue, unspecified type - Plan: Hemoglobin A1c, TSH, Vitamin B12, VITAMIN D 25 Hydroxy (Vit-D Deficiency, Fractures), Ferritin  -Will order complete lab panel including lipids for further risk stratification and other things like thyroid, vitamin D, vitamin B12, ferritin, CBC to rule out reasons for her fatigue. -She does have some CAD risk factors including hypertension, hyperlipidemia and obesity.  I feel she needs to be studied further and will refer her to cardiology. -2D echo has been ordered. 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  No acute ST or T wave changes.  Time spent: 35 minutes reviewing chart, interviewing and examining patient and formulating plan of care.   Patient Instructions  -Nice seeing you today!!  -Lab work today; will notify you once results are available.  -Referral to cardiology has been placed.    Chaya Jan, MD [facility]

## 2021-09-06 NOTE — Patient Instructions (Signed)
-  Nice seeing you today!!  -Lab work today; will notify you once results are available.  -Referral to cardiology has been placed.

## 2021-09-07 LAB — CBC WITH DIFFERENTIAL/PLATELET
Absolute Monocytes: 507 cells/uL (ref 200–950)
Basophils Absolute: 47 cells/uL (ref 0–200)
Basophils Relative: 0.6 %
Eosinophils Absolute: 218 cells/uL (ref 15–500)
Eosinophils Relative: 2.8 %
HCT: 41.9 % (ref 35.0–45.0)
Hemoglobin: 14 g/dL (ref 11.7–15.5)
Lymphs Abs: 2348 cells/uL (ref 850–3900)
MCH: 31.5 pg (ref 27.0–33.0)
MCHC: 33.4 g/dL (ref 32.0–36.0)
MCV: 94.2 fL (ref 80.0–100.0)
MPV: 13.4 fL — ABNORMAL HIGH (ref 7.5–12.5)
Monocytes Relative: 6.5 %
Neutro Abs: 4680 cells/uL (ref 1500–7800)
Neutrophils Relative %: 60 %
Platelets: 276 10*3/uL (ref 140–400)
RBC: 4.45 10*6/uL (ref 3.80–5.10)
RDW: 11.9 % (ref 11.0–15.0)
Total Lymphocyte: 30.1 %
WBC: 7.8 10*3/uL (ref 3.8–10.8)

## 2021-09-07 LAB — TSH: TSH: 1.46 mIU/L (ref 0.40–4.50)

## 2021-09-07 LAB — HEMOGLOBIN A1C
Hgb A1c MFr Bld: 5 % of total Hgb (ref <5.7)
Mean Plasma Glucose: 97 mg/dL
eAG (mmol/L): 5.4 mmol/L

## 2021-09-07 LAB — COMPREHENSIVE METABOLIC PANEL
AG Ratio: 1.4 (calc) (ref 1.0–2.5)
ALT: 13 U/L (ref 6–29)
AST: 17 U/L (ref 10–35)
Albumin: 4.4 g/dL (ref 3.6–5.1)
Alkaline phosphatase (APISO): 94 U/L (ref 37–153)
BUN: 18 mg/dL (ref 7–25)
CO2: 27 mmol/L (ref 20–32)
Calcium: 10.1 mg/dL (ref 8.6–10.4)
Chloride: 102 mmol/L (ref 98–110)
Creat: 0.8 mg/dL (ref 0.50–1.03)
Globulin: 3.2 g/dL (calc) (ref 1.9–3.7)
Glucose, Bld: 81 mg/dL (ref 65–99)
Potassium: 4.4 mmol/L (ref 3.5–5.3)
Sodium: 140 mmol/L (ref 135–146)
Total Bilirubin: 0.5 mg/dL (ref 0.2–1.2)
Total Protein: 7.6 g/dL (ref 6.1–8.1)

## 2021-09-07 LAB — LIPID PANEL
Cholesterol: 213 mg/dL — ABNORMAL HIGH (ref <200)
HDL: 66 mg/dL (ref >=50)
LDL Cholesterol (Calc): 131 mg/dL (calc) — ABNORMAL HIGH
Non-HDL Cholesterol (Calc): 147 mg/dL (calc) — ABNORMAL HIGH (ref <130)
Total CHOL/HDL Ratio: 3.2 (calc) (ref <5.0)
Triglycerides: 69 mg/dL (ref <150)

## 2021-09-07 LAB — VITAMIN B12: Vitamin B-12: 575 pg/mL (ref 200–1100)

## 2021-09-07 LAB — FERRITIN: Ferritin: 95 ng/mL (ref 16–232)

## 2021-09-07 LAB — VITAMIN D 25 HYDROXY (VIT D DEFICIENCY, FRACTURES): Vit D, 25-Hydroxy: 42 ng/mL (ref 30–100)

## 2021-09-29 NOTE — Progress Notes (Signed)
Cardiology Office Note   Date:  10/01/2021   ID:  Kayla, Fisher 1961/04/27, MRN [id number]  PCP:  Philip Aspen, Limmie Patricia, MD  Cardiologist:   Hollie Bartus [place], MD   Chief Complaint  Patient presents with   Chest Pain   Shortness of Breath      History of Present Illness: Kayla Fisher is a 60 y.o. female who is seen at the request of DR Philip Aspen for evaluation of chest pain and SOB. She has a history of morbid obesity and HTN. Also history of elevated cholesterol.   She notes symptoms of difficulty taking a deep breath. This is followed by pressure in the chest and "hurting" in her heart. This is not related to activity or meals. She states she is dealing with a lot of stress and thinks it is more stress related. No prior cardiac evaluation.  Past Medical History:  Diagnosis Date   Gestational diabetes mellitus (GDM)    Hypertension    Morbid obesity (HCC)     Past Surgical History:  Procedure Laterality Date   ANTERIOR (CYSTOCELE) AND POSTERIOR REPAIR (RECTOCELE) WITH XENFORM GRAFT AND SACROSPINOUS FIXATION     CHOLECYSTECTOMY     TOTAL ABDOMINAL HYSTERECTOMY       Current Outpatient Medications  Medication Sig Dispense Refill   Multiple Vitamin (MULTIVITAMIN) tablet Take 1 tablet by mouth daily.     No current facility-administered medications for this visit.    Allergies:   Patient has no known allergies.    Social History:  The patient  reports that she has never smoked. She has never used smokeless tobacco. She reports current alcohol use. She reports that she does not use drugs.   Family History:  The patient's family history includes Atrial fibrillation in her father; Diabetes in her father; Luiz Blare' disease in her father; Ovarian cancer in her mother and sister; Peripheral Artery Disease in her father; Sick sinus syndrome in her sister.    ROS:  Please see the history of present illness.   Otherwise, review of systems are  positive for none.   All other systems are reviewed and negative.    PHYSICAL EXAM: VS:  BP 110/70 (BP Location: Left Arm, Patient Position: Sitting, Cuff Size: Normal)   Pulse 67   Resp 20   Ht 5' (1.524 m)   Wt 159 lb (72.1 kg)   BMI 31.05 kg/m  , BMI Body mass index is 31.05 kg/m. GEN: Well nourished, well developed, in no acute distress HEENT: normal Neck: no JVD, carotid bruits, or masses Cardiac: RRR; no murmurs, rubs, or gallops,no edema  Respiratory:  clear to auscultation bilaterally, normal work of breathing GI: soft, nontender, nondistended, + BS MS: no deformity or atrophy Skin: warm and dry, no rash Neuro:  Strength and sensation are intact Psych: euthymic mood, full affect   EKG:  EKG is ordered today. The ekg ordered today demonstrates NSR rate 67. Incomplete RBBB. Normal. I have personally reviewed and interpreted this study.    Recent Labs: 09/06/2021: ALT 13; BUN 18; Creat 0.80; Hemoglobin 14.0; Platelets 276; Potassium 4.4; Sodium 140; TSH 1.46    Lipid Panel    Component Value Date/Time   CHOL 213 (H) 09/06/2021 1441   TRIG 69 09/06/2021 1441   HDL 66 09/06/2021 1441   CHOLHDL 3.2 09/06/2021 1441   VLDL 23.0 02/24/2020 0729   LDLCALC 131 (H) 09/06/2021 1441      Wt Readings from Last 3 Encounters:  10/01/21 159 lb (72.1 kg)  09/06/21 161 lb 6.4 oz (73.2 kg)  02/24/20 202 lb 9.6 oz (91.9 kg)      Other studies Reviewed: Additional studies/ records that were reviewed today include: none. Review of the above records demonstrates: N/A   ASSESSMENT AND PLAN:  1.  Chest pain with atypical features. History of HTN, gestational DM and hypercholesterolemia. We discussed further evaluation and have agreed to pursue coronary CTA. If she does have CAD this will also inform us as to the need for lipid lowering therapy.  2. Hypercholesterolemia 3. HTN. Now off meds since patient has lost weight.   Current medicines are reviewed at length with the  patient today.  The patient does not have concerns regarding medicines.  The following changes have been made:  no change  Labs/ tests ordered today include:  Coronary CTA        Disposition:   FU depending on results of CT  Signed, Dorn Hartshorne [place], MD  10/01/2021 10:11 AM    [facility] [address] [place], [place], [address] Phone [phone], Fax [phone]

## 2021-10-01 ENCOUNTER — Encounter: Payer: Self-pay | Admitting: Cardiology

## 2021-10-01 ENCOUNTER — Ambulatory Visit: Payer: BC Managed Care – PPO | Admitting: Cardiology

## 2021-10-01 ENCOUNTER — Other Ambulatory Visit: Payer: Self-pay

## 2021-10-01 VITALS — BP 110/70 | HR 67 | Resp 20 | Ht 60.0 in | Wt 159.0 lb

## 2021-10-01 DIAGNOSIS — I10 Essential (primary) hypertension: Secondary | ICD-10-CM

## 2021-10-01 DIAGNOSIS — R072 Precordial pain: Secondary | ICD-10-CM | POA: Diagnosis not present

## 2021-10-01 DIAGNOSIS — E7800 Pure hypercholesterolemia, unspecified: Secondary | ICD-10-CM | POA: Diagnosis not present

## 2021-10-01 DIAGNOSIS — R079 Chest pain, unspecified: Secondary | ICD-10-CM

## 2021-10-01 MED ORDER — METOPROLOL TARTRATE 50 MG PO TABS: ORAL_TABLET | ORAL | 0 refills | Status: DC

## 2021-10-01 NOTE — Patient Instructions (Addendum)
Medication Instructions:  Continue same medications   Lab Work: Bmet today   Testing/Procedures: Coronary CT  will be scheduled after approved by insurance  Follow instructions below   Follow-Up: At [facility], you and your health needs are our priority.  As part of our continuing mission to provide you with exceptional heart care, we have created designated Provider Care Teams.  These Care Teams include your primary Cardiologist (physician) and Advanced Practice Providers (APPs -  Physician Assistants and Nurse Practitioners) who all work together to provide you with the care you need, when you need it.  We recommend signing up for the patient portal called "MyChart".  Sign up information is provided on this After Visit Summary.  MyChart is used to connect with patients for Virtual Visits (Telemedicine).  Patients are able to view lab/test results, encounter notes, upcoming appointments, etc.  Non-urgent messages can be sent to your provider as well.   To learn more about what you can do with MyChart, go to [email or web address].      Your next appointment:  To Be Determined     The format for your next appointment: Office   Provider:  Dr.Jordan      Your cardiac CT will be scheduled at one of the below locations:   [facility] [address] [place], [place] [address] [phone]  OR  [facility] [address] [place], [place] [address] [phone]  If scheduled at [facility], please arrive at the [facility] main entrance (entrance A) of [facility] 30 minutes prior to test start time. You can use the FREE valet parking offered at the main entrance (encouraged to control the heart rate for the test) Proceed to the [facility] Radiology Department (first floor) to check-in and test prep.  If scheduled at [facility], please arrive 15 mins early for check-in  and test prep.  Please follow these instructions carefully (unless otherwise directed):  Hold all erectile dysfunction medications at least 3 days (72 hrs) prior to test.  On the Night Before the Test: Be sure to Drink plenty of water. Do not consume any caffeinated/decaffeinated beverages or chocolate 12 hours prior to your test. Do not take any antihistamines 12 hours prior to your test.   On the Day of the Test: Drink plenty of water until 1 hour prior to the test. Do not eat any food 4 hours prior to the test. You may take your regular medications prior to the test.  Take metoprolol 50 mg two hours prior to test. FEMALES- please wear underwire-free bra if available, avoid dresses & tight clothing         After the Test: Drink plenty of water. After receiving IV contrast, you may experience a mild flushed feeling. This is normal. On occasion, you may experience a mild rash up to 24 hours after the test. This is not dangerous. If this occurs, you can take Benadryl 25 mg and increase your fluid intake. If you experience trouble breathing, this can be serious. If it is severe call 911 IMMEDIATELY. If it is mild, please call our office.   Please allow 2-4 weeks for scheduling of routine cardiac CTs. Some insurance companies require a pre-authorization which may delay scheduling of this test.   For non-scheduling related questions, please contact the cardiac imaging nurse navigator should you have any questions/concerns: Rockwell Alexandria, Cardiac Imaging Nurse Navigator Larey Brick, Cardiac Imaging Nurse Navigator [place]  [facility] Direct Office Dial: [phone]   For scheduling needs, including cancellations and rescheduling, please call [place], [phone].

## 2021-10-04 DIAGNOSIS — E7800 Pure hypercholesterolemia, unspecified: Secondary | ICD-10-CM | POA: Diagnosis not present

## 2021-10-04 DIAGNOSIS — R079 Chest pain, unspecified: Secondary | ICD-10-CM | POA: Diagnosis not present

## 2021-10-04 DIAGNOSIS — I10 Essential (primary) hypertension: Secondary | ICD-10-CM | POA: Diagnosis not present

## 2021-10-04 DIAGNOSIS — R072 Precordial pain: Secondary | ICD-10-CM | POA: Diagnosis not present

## 2021-10-04 LAB — BASIC METABOLIC PANEL
BUN/Creatinine Ratio: 23 (ref 9–23)
BUN: 17 mg/dL (ref 6–24)
CO2: 25 mmol/L (ref 20–29)
Calcium: 10 mg/dL (ref 8.7–10.2)
Chloride: 101 mmol/L (ref 96–106)
Creatinine, Ser: 0.75 mg/dL (ref 0.57–1.00)
Glucose: 84 mg/dL (ref 70–99)
Potassium: 4.5 mmol/L (ref 3.5–5.2)
Sodium: 142 mmol/L (ref 134–144)
eGFR: 92 mL/min/{1.73_m2} (ref >59)

## 2021-10-10 ENCOUNTER — Telehealth (HOSPITAL_COMMUNITY): Payer: Self-pay | Admitting: *Deleted

## 2021-10-10 NOTE — Telephone Encounter (Signed)
Reaching out to patient to offer assistance regarding upcoming cardiac imaging study; pt verbalizes understanding of appt date/time, parking situation and where to check in, pre-test NPO status and medications ordered, and verified current allergies; name and call back number provided for further questions should they arise  Larey Brick RN Navigator Cardiac Imaging [facility] [phone] office [phone] cell  Patient to take 50mg  metoprolol tartrate two hours prior to cardiac CT scan. She is aware to arrive at 8am for her 8:30am scan.

## 2021-10-15 ENCOUNTER — Ambulatory Visit (HOSPITAL_COMMUNITY): Admission: RE | Admit: 2021-10-15 | Payer: BC Managed Care – PPO | Source: Ambulatory Visit

## 2021-11-29 DIAGNOSIS — H903 Sensorineural hearing loss, bilateral: Secondary | ICD-10-CM | POA: Diagnosis not present

## 2022-01-28 DIAGNOSIS — F4323 Adjustment disorder with mixed anxiety and depressed mood: Secondary | ICD-10-CM | POA: Diagnosis not present

## 2022-02-10 DIAGNOSIS — F4323 Adjustment disorder with mixed anxiety and depressed mood: Secondary | ICD-10-CM | POA: Diagnosis not present

## 2022-02-28 DIAGNOSIS — F4323 Adjustment disorder with mixed anxiety and depressed mood: Secondary | ICD-10-CM | POA: Diagnosis not present

## 2022-03-25 ENCOUNTER — Other Ambulatory Visit: Payer: BC Managed Care – PPO

## 2022-04-14 DIAGNOSIS — F4323 Adjustment disorder with mixed anxiety and depressed mood: Secondary | ICD-10-CM | POA: Diagnosis not present

## 2022-04-28 ENCOUNTER — Other Ambulatory Visit: Payer: Self-pay | Admitting: Internal Medicine

## 2022-04-28 ENCOUNTER — Other Ambulatory Visit (INDEPENDENT_AMBULATORY_CARE_PROVIDER_SITE_OTHER): Payer: BC Managed Care – PPO

## 2022-04-28 DIAGNOSIS — E782 Mixed hyperlipidemia: Secondary | ICD-10-CM

## 2022-04-28 LAB — LIPID PANEL
Cholesterol: 217 mg/dL — ABNORMAL HIGH (ref 0–200)
HDL: 67.7 mg/dL (ref >39.00)
LDL Cholesterol: 138 mg/dL — ABNORMAL HIGH (ref 0–99)
NonHDL: 149.01
Total CHOL/HDL Ratio: 3
Triglycerides: 56 mg/dL (ref 0.0–149.0)
VLDL: 11.2 mg/dL (ref 0.0–40.0)

## 2022-05-01 ENCOUNTER — Encounter: Payer: Self-pay | Admitting: Internal Medicine

## 2022-05-01 MED ORDER — ATORVASTATIN CALCIUM 20 MG PO TABS: 20.0000 mg | ORAL_TABLET | Freq: Every day | ORAL | 1 refills | Status: DC

## 2022-10-27 ENCOUNTER — Other Ambulatory Visit: Payer: Self-pay | Admitting: Internal Medicine

## 2023-04-16 ENCOUNTER — Encounter: Payer: Self-pay | Admitting: Internal Medicine

## 2023-04-20 NOTE — Telephone Encounter (Signed)
Noted  

## 2023-04-28 ENCOUNTER — Encounter: Payer: Self-pay | Admitting: Internal Medicine

## 2023-04-28 ENCOUNTER — Ambulatory Visit: Payer: BC Managed Care – PPO | Admitting: Internal Medicine

## 2023-04-28 VITALS — BP 129/83 | HR 73 | Temp 98.2°F | Wt 178.2 lb

## 2023-04-28 DIAGNOSIS — M545 Low back pain, unspecified: Secondary | ICD-10-CM

## 2023-04-28 DIAGNOSIS — M160 Bilateral primary osteoarthritis of hip: Secondary | ICD-10-CM | POA: Diagnosis not present

## 2023-04-28 DIAGNOSIS — J45998 Other asthma: Secondary | ICD-10-CM | POA: Diagnosis not present

## 2023-04-28 DIAGNOSIS — G8929 Other chronic pain: Secondary | ICD-10-CM | POA: Diagnosis not present

## 2023-04-28 MED ORDER — ALBUTEROL SULFATE HFA 108 (90 BASE) MCG/ACT IN AERS
2.0000 | INHALATION_SPRAY | Freq: Four times a day (QID) | RESPIRATORY_TRACT | 2 refills | Status: AC | PRN
Start: 2023-04-28 — End: ?

## 2023-04-28 NOTE — Progress Notes (Signed)
Established Patient Office Visit     CC/Reason for Visit: Discuss hip pain and back pain  HPI: Kayla Fisher is a 62 y.o. female who is coming in today for the above mentioned reasons.  She has had chronic low back and right greater than left hip pain.  She believes back in 2017 she had an MRI that showed hip osteoarthritis.  It has gotten progressively worse with age and weight gain.  She is having difficulty going up steps.   Past Medical/Surgical History: Past Medical History:  Diagnosis Date   Gestational diabetes mellitus (GDM)    Hypertension    Morbid obesity (HCC)     Past Surgical History:  Procedure Laterality Date   ANTERIOR (CYSTOCELE) AND POSTERIOR REPAIR (RECTOCELE) WITH XENFORM GRAFT AND SACROSPINOUS FIXATION     CHOLECYSTECTOMY     TOTAL ABDOMINAL HYSTERECTOMY      Social History:  reports that she has never smoked. She has never used smokeless tobacco. She reports current alcohol use. She reports that she does not use drugs.  Allergies: No Known Allergies  Family History:  Family History  Problem Relation Age of Onset   Ovarian cancer Mother    Diabetes Father    Luiz Blare' disease Father    Atrial fibrillation Father    Peripheral Artery Disease Father    Ovarian cancer Sister    Sick sinus syndrome Sister      Current Outpatient Medications:    albuterol (VENTOLIN HFA) 108 (90 Base) MCG/ACT inhaler, Inhale 2 puffs into the lungs every 6 (six) hours as needed for wheezing or shortness of breath., Disp: 8 g, Rfl: 2   atorvastatin (LIPITOR) 20 MG tablet, TAKE 1 TABLET BY MOUTH EVERY DAY, Disp: 90 tablet, Rfl: 1   Multiple Vitamin (MULTIVITAMIN) tablet, Take 1 tablet by mouth daily., Disp: , Rfl:   Review of Systems:  Negative unless indicated in HPI.   Physical Exam: Vitals:   04/28/23 1100 04/28/23 1105  BP: (!) 150/98 129/83  Pulse: 73   Temp: 98.2 F (36.8 C)   TempSrc: Oral   SpO2: 99%   Weight: 178 lb 3.2 oz (80.8 kg)      Body mass index is 34.8 kg/m.   Physical Exam Vitals reviewed.  Constitutional:      Appearance: Normal appearance.  HENT:     Head: Normocephalic and atraumatic.  Skin:    General: Skin is warm and dry.  Neurological:     General: No focal deficit present.     Mental Status: She is alert and oriented to person, place, and time.  Psychiatric:        Mood and Affect: Mood normal.        Behavior: Behavior normal.        Thought Content: Thought content normal.        Judgment: Judgment normal.      Impression and Plan:  Bilateral primary osteoarthritis of hip -     Ambulatory referral to Physical Therapy -     Ambulatory referral to Orthopedic Surgery  Seasonal asthma -     Albuterol Sulfate HFA; Inhale 2 puffs into the lungs every 6 (six) hours as needed for wheezing or shortness of breath.  Dispense: 8 g; Refill: 2  Chronic bilateral low back pain without sciatica -     Ambulatory referral to Physical Therapy -     Ambulatory referral to Orthopedic Surgery   -Albuterol inhaler provided for seasonal asthma related  to heat. -Placed referrals for physical therapy and orthopedics for evaluation of bilateral hip and low back pain.  She has a history of right greater than left hip osteoarthritis.  Time spent:21 minutes reviewing chart, interviewing and examining patient and formulating plan of care.     Chaya Jan, MD [facility]

## 2023-04-30 ENCOUNTER — Other Ambulatory Visit: Payer: Self-pay | Admitting: Internal Medicine

## 2023-05-04 ENCOUNTER — Encounter: Payer: Self-pay | Admitting: Rehabilitative and Restorative Service Providers"

## 2023-05-04 ENCOUNTER — Ambulatory Visit
Payer: BC Managed Care – PPO | Attending: Internal Medicine | Admitting: Rehabilitative and Restorative Service Providers"

## 2023-05-04 ENCOUNTER — Other Ambulatory Visit: Payer: Self-pay

## 2023-05-04 DIAGNOSIS — G8929 Other chronic pain: Secondary | ICD-10-CM | POA: Diagnosis not present

## 2023-05-04 DIAGNOSIS — M160 Bilateral primary osteoarthritis of hip: Secondary | ICD-10-CM | POA: Diagnosis not present

## 2023-05-04 DIAGNOSIS — M5459 Other low back pain: Secondary | ICD-10-CM | POA: Insufficient documentation

## 2023-05-04 DIAGNOSIS — R252 Cramp and spasm: Secondary | ICD-10-CM | POA: Diagnosis not present

## 2023-05-04 DIAGNOSIS — M545 Low back pain, unspecified: Secondary | ICD-10-CM | POA: Diagnosis not present

## 2023-05-04 DIAGNOSIS — R2689 Other abnormalities of gait and mobility: Secondary | ICD-10-CM | POA: Diagnosis not present

## 2023-05-04 DIAGNOSIS — M6281 Muscle weakness (generalized): Secondary | ICD-10-CM | POA: Diagnosis not present

## 2023-05-04 NOTE — Therapy (Signed)
OUTPATIENT PHYSICAL THERAPY LOWER EXTREMITY EVALUATION   Patient Name: Kayla Fisher MRN: [id number] DOB:[birth year], 62 y.o., female Today's Date: 05/04/2023  END OF SESSION:  PT End of Session - 05/04/23 0932     Visit Number 1    Date for PT Re-Evaluation 06/26/23    Authorization Type BC/BS    PT Start Time 0927    PT Stop Time 1010    PT Time Calculation (min) 43 min    Activity Tolerance Patient tolerated treatment well    Behavior During Therapy WFL for tasks assessed/performed             Past Medical History:  Diagnosis Date   Gestational diabetes mellitus (GDM)    Hypertension    Morbid obesity (HCC)    Past Surgical History:  Procedure Laterality Date   ANTERIOR (CYSTOCELE) AND POSTERIOR REPAIR (RECTOCELE) WITH XENFORM GRAFT AND SACROSPINOUS FIXATION     CHOLECYSTECTOMY     TOTAL ABDOMINAL HYSTERECTOMY     Patient Active Problem List   Diagnosis Date Noted   Hyperlipidemia 02/24/2020   Vitamin D deficiency 10/05/2019   Hypertension    Morbid obesity (HCC)    Gestational diabetes mellitus (GDM)     PCP: Philip Aspen, Limmie Patricia, MD  REFERRING PROVIDER: Philip Aspen, Limmie Patricia, MD  REFERRING DIAG: M16.0 (ICD-10-CM) - Bilateral primary osteoarthritis of hip M54.50,G89.29 (ICD-10-CM) - Chronic bilateral low back pain without sciatica  THERAPY DIAG:  Other low back pain - Plan: PT plan of care cert/re-cert  Muscle weakness (generalized) - Plan: PT plan of care cert/re-cert  Other abnormalities of gait and mobility - Plan: PT plan of care cert/re-cert  Cramp and spasm - Plan: PT plan of care cert/re-cert  Rationale for Evaluation and Treatment: Rehabilitation  ONSET DATE: Has had since 2017, but in past 3 months, the pain has started getting worse  SUBJECTIVE:   SUBJECTIVE STATEMENT: Pt reports that pain has been worsening in past 2 years, but in past few months, she has noted that she has been grumpier and has not had the energy  that she used to have.  Pain has impacted the relationship that she has with her granddaughters who are 9 and nearly 4.  Patient states that the pain initially started in her back and right hip, but she is now noticing pain in her left hip, as well.  Patient has a referral pending to Dr Alluisio. Pt notes that she has weak core.  Pt goes once a week [facility] center.  Pt reports difficulty with stairs at times and uses step to pattern and reliance on railing.  PERTINENT HISTORY: OA, HTN, Hx of hysterectomy and prolapsed bladder PAIN:  Are you having pain? Yes: NPRS scale: 7-8/10 Pain location: back and bilateral hips (right hip worse than left) Pain description: aching, sharp Aggravating factors: sitting.  "I can hardly move in the morning when I get out of bed." Relieving factors: Iburpofen and movement  PRECAUTIONS: None  RED FLAGS: Bowel or bladder incontinence: Yes: has a prolapsed bladder    WEIGHT BEARING RESTRICTIONS: No  FALLS:  Has patient fallen in last 6 months? No  LIVING ENVIRONMENT: Lives with: lives with their spouse Lives in: House/apartment Stairs: Yes: Internal: 4 steps; on right going up and External: 3 steps; on right going up Has following equipment at home: None  OCCUPATION: Retired  PLOF: Independent and Leisure: spend time with granddaughters  PATIENT GOALS: To help control the pain  and build the muscles up.  NEXT MD VISIT: Pending visit with Dr Berton Lan, Dr Ardyth Harps for yearly check up  OBJECTIVE:   DIAGNOSTIC FINDINGS: Pt believes that she had a MRI in 2017 which revealed hip OA  PATIENT SURVEYS:  Eval:  FOTO 52 (projected 63 by visit 12)  COGNITION: Overall cognitive status: Within functional limits for tasks assessed     SENSATION: Reports every once in a while when she gets up in the middle of the night, she has some foot tingling "maybe 3 times a month"  MUSCLE LENGTH: Piriformis tightness noted  bilat  POSTURE: rounded shoulders, forward head, and anterior pelvic tilt  PALPATION: Mild tenderness to palpation along lumbar paraspinals and bilat glutes/piriformis  LOWER EXTREMITY ROM:  WFL  LOWER EXTREMITY MMT:  Eval: Right hip strength of 4-/5 grossly throughout.  Right quad strength 4/5.  Right hamstring strength of 4-/5 Left hip strength of grossly 4/5.  Left quad strength is 5-/5.  Left hamstring strength is 4/5.  FUNCTIONAL TESTS:  05/04/2023: 5 times sit to stand: 9.25 sec without UE support Timed up and go (TUG): 9.1 sec  GAIT: Distance walked: >100 ft Assistive device utilized: None Level of assistance: Complete Independence Comments: Pt reports that she is able to ambulate as long as she needs to without having increased pain   TODAY'S TREATMENT:                                                                                                                              DATE: 05/04/2023  Issued HEP and reviewed.   PATIENT EDUCATION:  Education details: Issued HEP Person educated: Patient Education method: Explanation, Demonstration, and Handouts Education comprehension: verbalized understanding and returned demonstration  HOME EXERCISE PROGRAM: Access Code: [email or web address] URL: [email or web address] Date: 05/04/2023 Prepared by: [name]  Exercises - Seated Piriformis Stretch with Trunk Bend  - 1 x daily - 7 x weekly - 2 reps - 20 sec hold - Seated Hamstring Stretch  - 1 x daily - 7 x weekly - 2 reps - 20 sec hold - Seated Transversus Abdominis Bracing  - 1 x daily - 7 x weekly - 2 sets - 10 reps - 2 sec hold - Squat with Chair Touch  - 1 x daily - 7 x weekly - 2 sets - 10 reps - Supine Lower Trunk Rotation  - 1 x daily - 7 x weekly - 1 sets - 10 reps - Supine Posterior Pelvic Tilt  - 1 x daily - 7 x weekly - 2 sets - 10 reps - Supine Transversus Abdominis Bracing - Hands on Thighs  - 1 x daily - 7 x weekly - 2 sets - 10  reps  ASSESSMENT:  CLINICAL IMPRESSION: Patient is a 62 y.o. female who was seen today for physical therapy evaluation and treatment for Hip OA and low back pain. Patient presents with reporting most difficulty with sitting and states that she is having  difficulty navigating stairs in her home.  Patient reports that she was previously able to get on the floor with her youngest granddaughter and play games on the floor with her.  Patient presents with core weakness and hip weakness.  Patient reports most pain each morning when she wakes up and gets out of bed, then with increased sittings.  Pt states that she has a history of prolapsed bladder and occasionally has incontinence.  Pt educated about pelvic PT and educated to reach out to Dr Ardyth Harps if she would like to have this service in the future.  Pt would benefit from skilled PT to progress towards goal related activities and have decreased pain with functional tasks.  OBJECTIVE IMPAIRMENTS: decreased activity tolerance, difficulty walking, decreased strength, increased muscle spasms, impaired flexibility, postural dysfunction, and pain.   ACTIVITY LIMITATIONS: lifting, bending, and sitting  PARTICIPATION LIMITATIONS: community activity  PERSONAL FACTORS: Time since onset of injury/illness/exacerbation and 1-2 comorbidities: OA, prolapsed bladder  are also affecting patient's functional outcome.   REHAB POTENTIAL: Good  CLINICAL DECISION MAKING: Stable/uncomplicated  EVALUATION COMPLEXITY: Low   GOALS: Goals reviewed with patient? Yes  SHORT TERM GOALS: Target date: 05/29/2023 Pt will be independent with initial HEP. Baseline: Goal status: INITIAL  2.  Pt will report at least a 30% improvement in symptoms since starting initial evaluation. Baseline:  Goal status: INITIAL   LONG TERM GOALS: Target date: 06/26/2023  Pt will be independent with advanced HEP. Baseline:  Goal status: INITIAL  2.  Patient will increase FOTO to 63  to demonstrate improvements in functional mobility. Baseline: 52 Goal status: INITIAL  3.  Pt to be able to ride in the car/sit for at least 1 hour without increased pain. Baseline:  Goal status: INITIAL  4.  Pt to be able to sit on the floor and stand up without increased pain to allow her to play games with granddaughters. Baseline:  Goal status: INITIAL  5.  Pt will be able to increase bilat LE strength to at least 4+/5 to allow patient to navigate stairs with reciprocal pattern. Baseline:  Goal status: INITIAL     PLAN:  PT FREQUENCY: 2x/week  PT DURATION: 8 weeks  PLANNED INTERVENTIONS: Therapeutic exercises, Therapeutic activity, Neuromuscular re-education, Balance training, Gait training, Patient/Family education, Self Care, Joint mobilization, Joint manipulation, Stair training, Aquatic Therapy, Dry Needling, Spinal manipulation, Spinal mobilization, Cryotherapy, Moist heat, Taping, Ultrasound, Ionotophoresis 4mg /ml Dexamethasone, Manual therapy, and Re-evaluation  PLAN FOR NEXT SESSION: Assess and progress HEP as indicated, strengthening, flexibility   Reather Laurence, PT, DPT 05/04/2023, 10:24 AM   [facility] [address] [place], [place] [address] Phone # [phone] Fax [phone]

## 2023-05-04 NOTE — Patient Instructions (Signed)
     [facility] Welcome to [facility]! Here you will find all the information you will need regarding your pool therapy. If you have further questions at any time, please call our office at [phone]. After completing your initial evaluation in the [facility], you may be eligible to complete a portion of your therapy in the pool. A typical week of therapy will consist of 1-2 typical physical therapy visits at our [facility] location and an additional session of therapy in the pool located at the [facility] at [address]. [address], [place] [address]. The phone number at the pool site is [phone]. Please call this number if you are running late or need to cancel your appointment.  Aquatic therapy will be offered on Wednesday mornings and Friday afternoons. Each session will last approximately 45 minutes. All scheduling and payments for aquatic therapy sessions, including cancelations, will be done through our [facility] location.  To be eligible for aquatic therapy, these criteria must be met: You must be able to independently change in the locker room and get to the pool deck. A caregiver can come with you to help if needed. There are benches for a caregiver to sit on next to the pool. No one with an open wound is permitted in the pool.  Handicap parking is available in the front and there is a drop off option for even closer accessibility. Please arrive 15 minutes prior to your appointment to prepare for your pool session. You must sign in at the front desk upon your arrival. Please be sure to attend to any toileting needs prior to entering the pool. Locker rooms for changing are available.  There is direct access to the pool deck from the locker room. You can lock your belongings in a locker or bring them with you poolside. Your therapist will greet you on the pool deck. There may be other swimmers in the pool at the  same time but your session is one-on-one with the therapist.   

## 2023-05-07 ENCOUNTER — Ambulatory Visit: Payer: BC Managed Care – PPO

## 2023-05-07 DIAGNOSIS — M5459 Other low back pain: Secondary | ICD-10-CM | POA: Diagnosis not present

## 2023-05-07 DIAGNOSIS — G8929 Other chronic pain: Secondary | ICD-10-CM | POA: Diagnosis not present

## 2023-05-07 DIAGNOSIS — R252 Cramp and spasm: Secondary | ICD-10-CM | POA: Diagnosis not present

## 2023-05-07 DIAGNOSIS — M545 Low back pain, unspecified: Secondary | ICD-10-CM | POA: Diagnosis not present

## 2023-05-07 DIAGNOSIS — R2689 Other abnormalities of gait and mobility: Secondary | ICD-10-CM | POA: Diagnosis not present

## 2023-05-07 DIAGNOSIS — M6281 Muscle weakness (generalized): Secondary | ICD-10-CM | POA: Diagnosis not present

## 2023-05-07 DIAGNOSIS — M160 Bilateral primary osteoarthritis of hip: Secondary | ICD-10-CM | POA: Diagnosis not present

## 2023-05-07 NOTE — Therapy (Signed)
OUTPATIENT PHYSICAL THERAPY TREATMENT   Patient Name: Kayla Fisher MRN: [id number] DOB:1961-06-14, 62 y.o., female Today's Date: 05/07/2023  END OF SESSION:  PT End of Session - 05/07/23 1008     Visit Number 2    Date for PT Re-Evaluation 06/26/23    Authorization Type BC/BS    PT Start Time [phone]    PT Stop Time 1016    PT Time Calculation (min) 45 min    Activity Tolerance Patient tolerated treatment well    Behavior During Therapy WFL for tasks assessed/performed              Past Medical History:  Diagnosis Date   Gestational diabetes mellitus (GDM)    Hypertension    Morbid obesity (HCC)    Past Surgical History:  Procedure Laterality Date   ANTERIOR (CYSTOCELE) AND POSTERIOR REPAIR (RECTOCELE) WITH XENFORM GRAFT AND SACROSPINOUS FIXATION     CHOLECYSTECTOMY     TOTAL ABDOMINAL HYSTERECTOMY     Patient Active Problem List   Diagnosis Date Noted   Hyperlipidemia 02/24/2020   Vitamin D deficiency 10/05/2019   Hypertension    Morbid obesity (HCC)    Gestational diabetes mellitus (GDM)     PCP: Philip Aspen, Limmie Patricia, MD  REFERRING PROVIDER: Philip Aspen, Limmie Patricia, MD  REFERRING DIAG: M16.0 (ICD-10-CM) - Bilateral primary osteoarthritis of hip M54.50,G89.29 (ICD-10-CM) - Chronic bilateral low back pain without sciatica  THERAPY DIAG:  Other low back pain  Other abnormalities of gait and mobility  Cramp and spasm  Muscle weakness (generalized)  Rationale for Evaluation and Treatment: Rehabilitation  ONSET DATE: Has had since 2017, but in past 3 months, the pain has started getting worse  SUBJECTIVE:   SUBJECTIVE STATEMENT: I have been doing my exercises.  The trunk rotation exercise hurts when I do it on the floor at home.    From Evaluation: Pt reports that pain has been worsening in past 2 years, but in past few months, she has noted that she has been grumpier and has not had the energy that she used to have.  Pain has  impacted the relationship that she has with her granddaughters who are 9 and nearly 4.  Patient states that the pain initially started in her back and right hip, but she is now noticing pain in her left hip, as well.  Patient has a referral pending to Dr Alluisio. Pt notes that she has weak core.  Pt goes once a week to arthritis exercise class at the senior center.  Pt reports difficulty with stairs at times and uses step to pattern and reliance on railing.  PERTINENT HISTORY: OA, HTN, Hx of hysterectomy and prolapsed bladder PAIN:  Are you having pain? Yes: NPRS scale: 8/10 Pain location: back and bilateral hips (right hip worse than left) Pain description: aching, sharp Aggravating factors: sitting.  "I can hardly move in the morning when I get out of bed." Relieving factors: Iburpofen and movement  PRECAUTIONS: None  RED FLAGS: Bowel or bladder incontinence: Yes: has a prolapsed bladder    WEIGHT BEARING RESTRICTIONS: No  FALLS:  Has patient fallen in last 6 months? No  LIVING ENVIRONMENT: Lives with: lives with their spouse Lives in: House/apartment Stairs: Yes: Internal: 4 steps; on right going up and External: 3 steps; on right going up Has following equipment at home: None  OCCUPATION: Retired  PLOF: Independent and Leisure: spend time with granddaughters  PATIENT GOALS: To help control the pain and build the  muscles up.  NEXT MD VISIT: Pending visit with Dr Berton Lan, Dr Ardyth Harps for yearly check up  OBJECTIVE:   DIAGNOSTIC FINDINGS: Pt believes that she had a MRI in 2017 which revealed hip OA  PATIENT SURVEYS:  Eval:  FOTO 52 (projected 63 by visit 12)  COGNITION: Overall cognitive status: Within functional limits for tasks assessed     SENSATION: Reports every once in a while when she gets up in the middle of the night, she has some foot tingling "maybe 3 times a month"  MUSCLE LENGTH: Piriformis tightness noted bilat  POSTURE: rounded shoulders, forward  head, and anterior pelvic tilt  PALPATION: Mild tenderness to palpation along lumbar paraspinals and bilat glutes/piriformis  LOWER EXTREMITY ROM:  WFL  LOWER EXTREMITY MMT:  Eval: Right hip strength of 4-/5 grossly throughout.  Right quad strength 4/5.  Right hamstring strength of 4-/5 Left hip strength of grossly 4/5.  Left quad strength is 5-/5.  Left hamstring strength is 4/5.  FUNCTIONAL TESTS:  05/04/2023: 5 times sit to stand: 9.25 sec without UE support Timed up and go (TUG): 9.1 sec  GAIT: Distance walked: >100 ft Assistive device utilized: None Level of assistance: Complete Independence Comments: Pt reports that she is able to ambulate as long as she needs to without having increased pain   TODAY'S TREATMENT:        DATE: 05/07/2023  NuStep: Level 3x 6 minutes- PT present to discuss progress Seated hamstring stretch 3x20 seconds  Seated piriformis stretch Sit to stand 2x10 Supine pelvic tilt and TA activation Ball squeezes and hip abuduction with band  Electrical stim to low back and gluteals with heat x12 minutes                                                                                                                         DATE: 05/04/2023  Issued HEP and reviewed.   PATIENT EDUCATION:  Education details: Issued HEP Person educated: Patient Education method: Explanation, Demonstration, and Handouts Education comprehension: verbalized understanding and returned demonstration  HOME EXERCISE PROGRAM: Access Code: [email or web address] URL: [email or web address] Date: 05/07/2023 Prepared by: Tresa Endo  Exercises - Seated Piriformis Stretch with Trunk Bend  - 1 x daily - 7 x weekly - 2 reps - 20 sec hold - Seated Hamstring Stretch  - 1 x daily - 7 x weekly - 2 reps - 20 sec hold - Seated Transversus Abdominis Bracing  - 1 x daily - 7 x weekly - 2 sets - 10 reps - 2 sec hold - Squat with Chair Touch  - 1 x daily - 7 x weekly - 2 sets - 10 reps - Supine  Lower Trunk Rotation  - 1 x daily - 7 x weekly - 1 sets - 10 reps - Supine Posterior Pelvic Tilt  - 1 x daily - 7 x weekly - 2 sets - 10 reps - Supine Transversus Abdominis Bracing - Hands on Thighs  - 1 x daily - 7 x  weekly - 2 sets - 10 reps - Supine Hip Adduction Isometric with Ball  - 2 x daily - 7 x weekly - 2 sets - 10 reps - Hooklying Clamshell with Resistance  - 2 x daily - 7 x weekly - 2 sets - 10 reps ASSESSMENT:  CLINICAL IMPRESSION: First time follow-up after evaluation.  Pt has been compliant with HEP and demonstrated all aspects correctly.  PT educated pt regarding DN and issued handouts and she will consider for next session.  Pt with good TA activation and required minor verbal cues for breathing.  Trial of electrical stimulation and PT provided information for home TENS unit.   Pt would benefit from skilled PT to progress towards goal related activities and have decreased pain with functional tasks.  OBJECTIVE IMPAIRMENTS: decreased activity tolerance, difficulty walking, decreased strength, increased muscle spasms, impaired flexibility, postural dysfunction, and pain.   ACTIVITY LIMITATIONS: lifting, bending, and sitting  PARTICIPATION LIMITATIONS: community activity  PERSONAL FACTORS: Time since onset of injury/illness/exacerbation and 1-2 comorbidities: OA, prolapsed bladder  are also affecting patient's functional outcome.   REHAB POTENTIAL: Good  CLINICAL DECISION MAKING: Stable/uncomplicated  EVALUATION COMPLEXITY: Low   GOALS: Goals reviewed with patient? Yes  SHORT TERM GOALS: Target date: 05/29/2023 Pt will be independent with initial HEP. Baseline: Goal status: In progress   2.  Pt will report at least a 30% improvement in symptoms since starting initial evaluation. Baseline:  Goal status: INITIAL   LONG TERM GOALS: Target date: 06/26/2023  Pt will be independent with advanced HEP. Baseline:  Goal status: INITIAL  2.  Patient will increase FOTO to 63  to demonstrate improvements in functional mobility. Baseline: 52 Goal status: INITIAL  3.  Pt to be able to ride in the car/sit for at least 1 hour without increased pain. Baseline:  Goal status: INITIAL  4.  Pt to be able to sit on the floor and stand up without increased pain to allow her to play games with granddaughters. Baseline:  Goal status: INITIAL  5.  Pt will be able to increase bilat LE strength to at least 4+/5 to allow patient to navigate stairs with reciprocal pattern. Baseline:  Goal status: INITIAL     PLAN:  PT FREQUENCY: 2x/week  PT DURATION: 8 weeks  PLANNED INTERVENTIONS: Therapeutic exercises, Therapeutic activity, Neuromuscular re-education, Balance training, Gait training, Patient/Family education, Self Care, Joint mobilization, Joint manipulation, Stair training, Aquatic Therapy, Dry Needling, Spinal manipulation, Spinal mobilization, Cryotherapy, Moist heat, Taping, Ultrasound, Ionotophoresis 4mg /ml Dexamethasone, Manual therapy, and Re-evaluation  PLAN FOR NEXT SESSION: Assess and progress HEP as indicated, strengthening, flexibility.  She how she responded with electrical stimulation.  DN if patient agrees.   Lorrene Reid, PT 05/07/23 10:14 AM   [facility] [address], [address] [place], [place] [address] Phone # [phone] Fax [phone]

## 2023-05-12 ENCOUNTER — Ambulatory Visit: Payer: BC Managed Care – PPO

## 2023-05-12 DIAGNOSIS — R2689 Other abnormalities of gait and mobility: Secondary | ICD-10-CM | POA: Diagnosis not present

## 2023-05-12 DIAGNOSIS — M545 Low back pain, unspecified: Secondary | ICD-10-CM | POA: Diagnosis not present

## 2023-05-12 DIAGNOSIS — G8929 Other chronic pain: Secondary | ICD-10-CM | POA: Diagnosis not present

## 2023-05-12 DIAGNOSIS — M5459 Other low back pain: Secondary | ICD-10-CM

## 2023-05-12 DIAGNOSIS — R252 Cramp and spasm: Secondary | ICD-10-CM | POA: Diagnosis not present

## 2023-05-12 DIAGNOSIS — M6281 Muscle weakness (generalized): Secondary | ICD-10-CM | POA: Diagnosis not present

## 2023-05-12 DIAGNOSIS — M160 Bilateral primary osteoarthritis of hip: Secondary | ICD-10-CM | POA: Diagnosis not present

## 2023-05-12 NOTE — Therapy (Signed)
OUTPATIENT PHYSICAL THERAPY TREATMENT   Patient Name: Kayla Fisher MRN: [id number] DOB:03/11/1961, 62 y.o., female Today's Date: 05/12/2023  END OF SESSION:  PT End of Session - 05/12/23 1146     Visit Number 3    Date for PT Re-Evaluation 06/26/23    Authorization Type BC/BS    PT Start Time 1106    PT Stop Time 1155    PT Time Calculation (min) 49 min    Activity Tolerance Patient tolerated treatment well    Behavior During Therapy WFL for tasks assessed/performed               Past Medical History:  Diagnosis Date   Gestational diabetes mellitus (GDM)    Hypertension    Morbid obesity (HCC)    Past Surgical History:  Procedure Laterality Date   ANTERIOR (CYSTOCELE) AND POSTERIOR REPAIR (RECTOCELE) WITH XENFORM GRAFT AND SACROSPINOUS FIXATION     CHOLECYSTECTOMY     TOTAL ABDOMINAL HYSTERECTOMY     Patient Active Problem List   Diagnosis Date Noted   Hyperlipidemia 02/24/2020   Vitamin D deficiency 10/05/2019   Hypertension    Morbid obesity (HCC)    Gestational diabetes mellitus (GDM)     PCP: Philip Aspen, Limmie Patricia, MD  REFERRING PROVIDER: Philip Aspen, Limmie Patricia, MD  REFERRING DIAG: M16.0 (ICD-10-CM) - Bilateral primary osteoarthritis of hip M54.50,G89.29 (ICD-10-CM) - Chronic bilateral low back pain without sciatica  THERAPY DIAG:  Other low back pain  Other abnormalities of gait and mobility  Cramp and spasm  Muscle weakness (generalized)  Rationale for Evaluation and Treatment: Rehabilitation  ONSET DATE: Has had since 2017, but in past 3 months, the pain has started getting worse  SUBJECTIVE:   SUBJECTIVE STATEMENT: I didn't have pain when I drove home from here last visit.  I ordered a TENs unit and it should be delivered today.      From Evaluation: Pt reports that pain has been worsening in past 2 years, but in past few months, she has noted that she has been grumpier and has not had the energy that she used to  have.  Pain has impacted the relationship that she has with her granddaughters who are 9 and nearly 4.  Patient states that the pain initially started in her back and right hip, but she is now noticing pain in her left hip, as well.  Patient has a referral pending to Dr Alluisio. Pt notes that she has weak core.  Pt [facility] a [facility]  Pt reports difficulty with stairs at times and uses step to pattern and reliance on railing.  PERTINENT HISTORY: OA, HTN, Hx of hysterectomy and prolapsed bladder PAIN:  Are you having pain? Yes: NPRS scale: 7/10 Pain location: back and bilateral hips (right hip worse than left) Pain description: aching, sharp Aggravating factors: sitting.  "I can hardly move in the morning when I get out of bed." Relieving factors: Iburpofen and movement  PRECAUTIONS: None  RED FLAGS: Bowel or bladder incontinence: Yes: has a prolapsed bladder    WEIGHT BEARING RESTRICTIONS: No  FALLS:  Has patient fallen in last 6 months? No  LIVING ENVIRONMENT: Lives with: lives with their spouse Lives in: House/apartment Stairs: Yes: Internal: 4 steps; on right going up and External: 3 steps; on right going up Has following equipment at home: None  OCCUPATION: Retired  PLOF: Independent and Leisure: spend time with granddaughters  PATIENT GOALS: To help  control the pain and build the muscles up.  NEXT MD VISIT: Pending visit with Dr Berton Lan, Dr Ardyth Harps for yearly check up  OBJECTIVE:   DIAGNOSTIC FINDINGS: Pt believes that she had a MRI in 2017 which revealed hip OA  PATIENT SURVEYS:  Eval:  FOTO 52 (projected 63 by visit 12)  COGNITION: Overall cognitive status: Within functional limits for tasks assessed     SENSATION: Reports every once in a while when she gets up in the middle of the night, she has some foot tingling "maybe 3 times a month"  MUSCLE LENGTH: Piriformis tightness noted bilat  POSTURE: rounded  shoulders, forward head, and anterior pelvic tilt  PALPATION: Mild tenderness to palpation along lumbar paraspinals and bilat glutes/piriformis  LOWER EXTREMITY ROM: WFL  LOWER EXTREMITY MMT: Eval: Right hip strength of 4-/5 grossly throughout.  Right quad strength 4/5.  Right hamstring strength of 4-/5 Left hip strength of grossly 4/5.  Left quad strength is 5-/5.  Left hamstring strength is 4/5.  FUNCTIONAL TESTS:  05/04/2023: 5 times sit to stand: 9.25 sec without UE support Timed up and go (TUG): 9.1 sec  GAIT: Distance walked: >100 ft Assistive device utilized: None Level of assistance: Complete Independence Comments: Pt reports that she is able to ambulate as long as she needs to without having increased pain   TODAY'S TREATMENT:        DATE: 05/12/2023  NuStep: Level 3x 6 minutes- PT present to discuss progress Seated hamstring stretch 3x20 seconds  Seated piriformis stretch Sit to stand 2x10 Supine pelvic tilt and TA activation Ball squeezes and hip abuduction with band  Electrical stim to low back and gluteals with heat x12 minutes   DATE: 05/07/2023  NuStep: Level 3x 6 minutes- PT present to discuss progress Seated hamstring stretch 3x20 seconds  Seated piriformis stretch Sit to stand 2x10 Supine pelvic tilt and TA activation Ball squeezes and hip abuduction with band  Electrical stim to low back and gluteals with heat x12 minutes                                                                                                                         DATE: 05/04/2023  Issued HEP and reviewed.  PATIENT EDUCATION:  Education details: Issued HEP Person educated: Patient Education method: Explanation, Demonstration, and Handouts Education comprehension: verbalized understanding and returned demonstration  HOME EXERCISE PROGRAM: Access Code: [email or web address] URL: [email or web address] Date: 05/07/2023 Prepared by: Tresa Endo  Exercises - Seated Piriformis  Stretch with Trunk Bend  - 1 x daily - 7 x weekly - 2 reps - 20 sec hold - Seated Hamstring Stretch  - 1 x daily - 7 x weekly - 2 reps - 20 sec hold - Seated Transversus Abdominis Bracing  - 1 x daily - 7 x weekly - 2 sets - 10 reps - 2 sec hold - Squat with Chair Touch  - 1 x daily - 7 x weekly - 2 sets - 10 reps -  Supine Lower Trunk Rotation  - 1 x daily - 7 x weekly - 1 sets - 10 reps - Supine Posterior Pelvic Tilt  - 1 x daily - 7 x weekly - 2 sets - 10 reps - Supine Transversus Abdominis Bracing - Hands on Thighs  - 1 x daily - 7 x weekly - 2 sets - 10 reps - Supine Hip Adduction Isometric with Ball  - 2 x daily - 7 x weekly - 2 sets - 10 reps - Hooklying Clamshell with Resistance  - 2 x daily - 7 x weekly - 2 sets - 10 reps ASSESSMENT:  CLINICAL IMPRESSION: Pt denies any significant change in her pain overall.  She reports that she had some reduced pain after electrical stimulation last session and has ordered a home unit.  No increased pain with HEP or exercise in the clinic.  She will see orthopedic MD this week to discuss symptoms.   Pt would benefit from skilled PT to progress towards goal related activities and have decreased pain with functional tasks.  OBJECTIVE IMPAIRMENTS: decreased activity tolerance, difficulty walking, decreased strength, increased muscle spasms, impaired flexibility, postural dysfunction, and pain.   ACTIVITY LIMITATIONS: lifting, bending, and sitting  PARTICIPATION LIMITATIONS: community activity  PERSONAL FACTORS: Time since onset of injury/illness/exacerbation and 1-2 comorbidities: OA, prolapsed bladder  are also affecting patient's functional outcome.   REHAB POTENTIAL: Good  CLINICAL DECISION MAKING: Stable/uncomplicated  EVALUATION COMPLEXITY: Low   GOALS: Goals reviewed with patient? Yes  SHORT TERM GOALS: Target date: 05/29/2023 Pt will be independent with initial HEP. Baseline: Goal status: In progress   2.  Pt will report at least a  30% improvement in symptoms since starting initial evaluation. Baseline:  Goal status: INITIAL   LONG TERM GOALS: Target date: 06/26/2023  Pt will be independent with advanced HEP. Baseline:  Goal status: INITIAL  2.  Patient will increase FOTO to 63 to demonstrate improvements in functional mobility. Baseline: 52 Goal status: INITIAL  3.  Pt to be able to ride in the car/sit for at least 1 hour without increased pain. Baseline:  Goal status: INITIAL  4.  Pt to be able to sit on the floor and stand up without increased pain to allow her to play games with granddaughters. Baseline:  Goal status: INITIAL  5.  Pt will be able to increase bilat LE strength to at least 4+/5 to allow patient to navigate stairs with reciprocal pattern. Baseline:  Goal status: INITIAL  PLAN:  PT FREQUENCY: 2x/week  PT DURATION: 8 weeks  PLANNED INTERVENTIONS: Therapeutic exercises, Therapeutic activity, Neuromuscular re-education, Balance training, Gait training, Patient/Family education, Self Care, Joint mobilization, Joint manipulation, Stair training, Aquatic Therapy, Dry Needling, Spinal manipulation, Spinal mobilization, Cryotherapy, Moist heat, Taping, Ultrasound, Ionotophoresis 4mg /ml Dexamethasone, Manual therapy, and Re-evaluation  PLAN FOR NEXT SESSION: Assess and progress HEP as indicated, strengthening, flexibility.  She will see MD in 3 days, see what MD says,  DN if patient agrees.   Lorrene Reid, PT 05/12/23 11:48 AM   [facility] [address] [place], [place] [address] Phone # [phone] Fax [phone]

## 2023-05-15 ENCOUNTER — Encounter: Payer: Self-pay | Admitting: Physical Therapy

## 2023-05-15 ENCOUNTER — Ambulatory Visit: Payer: BC Managed Care – PPO | Admitting: Physical Therapy

## 2023-05-15 DIAGNOSIS — M6281 Muscle weakness (generalized): Secondary | ICD-10-CM

## 2023-05-15 DIAGNOSIS — M533 Sacrococcygeal disorders, not elsewhere classified: Secondary | ICD-10-CM | POA: Diagnosis not present

## 2023-05-15 DIAGNOSIS — M5459 Other low back pain: Secondary | ICD-10-CM | POA: Diagnosis not present

## 2023-05-15 DIAGNOSIS — G8929 Other chronic pain: Secondary | ICD-10-CM | POA: Diagnosis not present

## 2023-05-15 DIAGNOSIS — R2689 Other abnormalities of gait and mobility: Secondary | ICD-10-CM | POA: Diagnosis not present

## 2023-05-15 DIAGNOSIS — R252 Cramp and spasm: Secondary | ICD-10-CM | POA: Diagnosis not present

## 2023-05-15 DIAGNOSIS — M160 Bilateral primary osteoarthritis of hip: Secondary | ICD-10-CM | POA: Diagnosis not present

## 2023-05-15 DIAGNOSIS — M545 Low back pain, unspecified: Secondary | ICD-10-CM | POA: Diagnosis not present

## 2023-05-15 NOTE — Therapy (Signed)
OUTPATIENT PHYSICAL THERAPY TREATMENT   Patient Name: Kayla Fisher MRN: [id number] DOB:September 27, 1961, 62 y.o., female Today's Date: 05/15/2023  END OF SESSION:  PT End of Session - 05/15/23 1642     Visit Number 4    Date for PT Re-Evaluation 06/26/23    Authorization Type BC/BS    PT Start Time 1345    PT Stop Time 1430    PT Time Calculation (min) 45 min    Activity Tolerance Patient tolerated treatment well    Behavior During Therapy WFL for tasks assessed/performed                Past Medical History:  Diagnosis Date   Gestational diabetes mellitus (GDM)    Hypertension    Morbid obesity (HCC)    Past Surgical History:  Procedure Laterality Date   ANTERIOR (CYSTOCELE) AND POSTERIOR REPAIR (RECTOCELE) WITH XENFORM GRAFT AND SACROSPINOUS FIXATION     CHOLECYSTECTOMY     TOTAL ABDOMINAL HYSTERECTOMY     Patient Active Problem List   Diagnosis Date Noted   Hyperlipidemia 02/24/2020   Vitamin D deficiency 10/05/2019   Hypertension    Morbid obesity (HCC)    Gestational diabetes mellitus (GDM)     PCP: Philip Aspen, Limmie Patricia, MD  REFERRING PROVIDER: Philip Aspen, Limmie Patricia, MD  REFERRING DIAG: M16.0 (ICD-10-CM) - Bilateral primary osteoarthritis of hip M54.50,G89.29 (ICD-10-CM) - Chronic bilateral low back pain without sciatica  THERAPY DIAG:  Other low back pain  Other abnormalities of gait and mobility  Cramp and spasm  Muscle weakness (generalized)  Rationale for Evaluation and Treatment: Rehabilitation  ONSET DATE: Has had since 2017, but in past 3 months, the pain has started getting worse  SUBJECTIVE:   SUBJECTIVE STATEMENT: Not too bad since it is the afternoon. Currently no pain. 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    From Evaluation: Pt reports that pain has been worsening in past 2 years, but in past few months, she has noted that she has been grumpier and has not had the energy that she used to have.  Pain has impacted  the relationship that she has with her granddaughters who are 9 and nearly 4.  Patient states that the pain initially started in her back and right hip, but she is now noticing pain in her left hip, as well.  Patient has a referral pending to Dr Alluisio. Pt notes that she has weak core.  Pt goes once a week to arthritis exercise class at the senior center.  Pt reports difficulty with stairs at times and uses step to pattern and reliance on railing.  PERTINENT HISTORY: OA, HTN, Hx of hysterectomy and prolapsed bladder PAIN:  Are you having pain? Yes: NPRS scale: 7/10 Pain location: back and bilateral hips (right hip worse than left) Pain description: aching, sharp Aggravating factors: sitting.  "I can hardly move in the morning when I get out of bed." Relieving factors: Iburpofen and movement  PRECAUTIONS: None  RED FLAGS: Bowel or bladder incontinence: Yes: has a prolapsed bladder    WEIGHT BEARING RESTRICTIONS: No  FALLS:  Has patient fallen in last 6 months? No  LIVING ENVIRONMENT: Lives with: lives with their spouse Lives in: House/apartment Stairs: Yes: Internal: 4 steps; on right going up and External: 3 steps; on right going up Has following equipment at home: None  OCCUPATION: Retired  PLOF: Independent and Leisure: spend time with granddaughters  PATIENT GOALS: To help control the pain and  build the muscles up.  NEXT MD VISIT: Pending visit with Dr Berton Lan, Dr Ardyth Harps for yearly check up  OBJECTIVE:   DIAGNOSTIC FINDINGS: Pt believes that she had a MRI in 2017 which revealed hip OA  PATIENT SURVEYS:  Eval:  FOTO 52 (projected 63 by visit 12)  COGNITION: Overall cognitive status: Within functional limits for tasks assessed     SENSATION: Reports every once in a while when she gets up in the middle of the night, she has some foot tingling "maybe 3 times a month"  MUSCLE LENGTH: Piriformis tightness noted bilat  POSTURE: rounded shoulders, forward head, and  anterior pelvic tilt  PALPATION: Mild tenderness to palpation along lumbar paraspinals and bilat glutes/piriformis  LOWER EXTREMITY ROM: WFL  LOWER EXTREMITY MMT: Eval: Right hip strength of 4-/5 grossly throughout.  Right quad strength 4/5.  Right hamstring strength of 4-/5 Left hip strength of grossly 4/5.  Left quad strength is 5-/5.  Left hamstring strength is 4/5.  FUNCTIONAL TESTS:  05/04/2023: 5 times sit to stand: 9.25 sec without UE support Timed up and go (TUG): 9.1 sec  GAIT: Distance walked: >100 ft Assistive device utilized: None Level of assistance: Complete Independence Comments: Pt reports that she is able to ambulate as long as she needs to without having increased pain   TODAY'S TREATMENT:     05/15/23: Pt arrives for aquatic physical therapy. Treatment took place in 3.5-5.5 feet of water. Water temperature was. Pt entered the pool via stairs. Pt requires buoyancy of water for support and to offload joints with strengthening exercises. Seated water bench with 75% submersion Pt performed seated LE AROM exercises 20x in all planes, with concurrent discussion of current status,pain, MD appt and a short AROM routine she could do in bed before getting up in the AM to help with the transition. Verbal education on water principles and how we would use them.  75% depth water walking 6 lengths with natural arms. VC for posture during forward walking. Hip 3 ways no rest 10x each Bil, small UE assistance for balance. Standing against the wall: anchor tailbone & scapula: Post pelvic tilt 5x 3 sec hold, then hold posistion and perform lat/core press with single buoy UE wt 10x. Vc to engage glutes. Hamstring stretch on second step Bil 3 breaths 3x then standing childs pose 2x 3 breaths.       DATE: 05/12/2023  NuStep: Level 3x 6 minutes- PT present to discuss progress Seated hamstring stretch 3x20 seconds  Seated piriformis stretch Sit to stand 2x10 Supine pelvic tilt and TA  activation Ball squeezes and hip abuduction with band  Electrical stim to low back and gluteals with heat x12 minutes   DATE: 05/07/2023  NuStep: Level 3x 6 minutes- PT present to discuss progress Seated hamstring stretch 3x20 seconds  Seated piriformis stretch Sit to stand 2x10 Supine pelvic tilt and TA activation Ball squeezes and hip abuduction with band  Electrical stim to low back and gluteals with heat x12 minutes  DATE: 05/04/2023  Issued HEP and reviewed.  PATIENT EDUCATION:  Education details: Issued HEP Person educated: Patient Education method: Explanation, Demonstration, and Handouts Education comprehension: verbalized understanding and returned demonstration  HOME EXERCISE PROGRAM: Access Code: [email or web address] URL: [email or web address] Date: 05/07/2023 Prepared by: Tresa Endo  Exercises - Seated Piriformis Stretch with Trunk Bend  - 1 x daily - 7 x weekly - 2 reps - 20 sec hold - Seated Hamstring Stretch  - 1 x daily - 7 x weekly - 2 reps - 20 sec hold - Seated Transversus Abdominis Bracing  - 1 x daily - 7 x weekly - 2 sets - 10 reps - 2 sec hold - Squat with Chair Touch  - 1 x daily - 7 x weekly - 2 sets - 10 reps - Supine Lower Trunk Rotation  - 1 x daily - 7 x weekly - 1 sets - 10 reps - Supine Posterior Pelvic Tilt  - 1 x daily - 7 x weekly - 2 sets - 10 reps - Supine Transversus Abdominis Bracing - Hands on Thighs  - 1 x daily - 7 x weekly - 2 sets - 10 reps - Supine Hip Adduction Isometric with Ball  - 2 x daily - 7 x weekly - 2 sets - 10 reps - Hooklying Clamshell with Resistance  - 2 x daily - 7 x weekly - 2 sets - 10 reps ASSESSMENT:  CLINICAL IMPRESSION: Pt arrived for initial aquatic PT treatment today pain free. Pt reports she really likes exercising in the water and could possibly see a future exercise program involving aquatic  exercise. Pt did very well with beginner exercises requiring mainly verbal reminders on her forward posture/lean. Pt had a lot of lumbar "pressure" relief with the post pelvic tilt.   OBJECTIVE IMPAIRMENTS: decreased activity tolerance, difficulty walking, decreased strength, increased muscle spasms, impaired flexibility, postural dysfunction, and pain.   ACTIVITY LIMITATIONS: lifting, bending, and sitting  PARTICIPATION LIMITATIONS: community activity  PERSONAL FACTORS: Time since onset of injury/illness/exacerbation and 1-2 comorbidities: OA, prolapsed bladder  are also affecting patient's functional outcome.   REHAB POTENTIAL: Good  CLINICAL DECISION MAKING: Stable/uncomplicated  EVALUATION COMPLEXITY: Low   GOALS: Goals reviewed with patient? Yes  SHORT TERM GOALS: Target date: 05/29/2023 Pt will be independent with initial HEP. Baseline: Goal status: In progress   2.  Pt will report at least a 30% improvement in symptoms since starting initial evaluation. Baseline:  Goal status: INITIAL   LONG TERM GOALS: Target date: 06/26/2023  Pt will be independent with advanced HEP. Baseline:  Goal status: INITIAL  2.  Patient will increase FOTO to 63 to demonstrate improvements in functional mobility. Baseline: 52 Goal status: INITIAL  3.  Pt to be able to ride in the car/sit for at least 1 hour without increased pain. Baseline:  Goal status: INITIAL  4.  Pt to be able to sit on the floor and stand up without increased pain to allow her to play games with granddaughters. Baseline:  Goal status: INITIAL  5.  Pt will be able to increase bilat LE strength to at least 4+/5 to allow patient to navigate stairs with reciprocal pattern. Baseline:  Goal status: INITIAL  PLAN:  PT FREQUENCY: 2x/week  PT DURATION: 8 weeks  PLANNED INTERVENTIONS: Therapeutic exercises, Therapeutic activity, Neuromuscular re-education, Balance training, Gait training, Patient/Family education, Self  Care, Joint mobilization, Joint manipulation, Stair training, Aquatic Therapy, Dry Needling, Spinal manipulation, Spinal mobilization, Cryotherapy, Moist heat, Taping, Ultrasound, Ionotophoresis 4mg /ml Dexamethasone, Manual  therapy, and Re-evaluation  PLAN FOR NEXT SESSION: Asses response to first aquatic session.  Ane Payment, PTA 05/15/23 4:43 PM   [facility] [address] [place], [place] [address] Phone # [phone] Fax [phone]

## 2023-05-18 ENCOUNTER — Encounter: Payer: Self-pay | Admitting: Internal Medicine

## 2023-05-18 DIAGNOSIS — Z1231 Encounter for screening mammogram for malignant neoplasm of breast: Secondary | ICD-10-CM

## 2023-05-21 NOTE — Therapy (Signed)
OUTPATIENT PHYSICAL THERAPY TREATMENT   Patient Name: Kayla Fisher MRN: [id number] DOB:[birth year], 62 y.o., female Today's Date: 05/22/2023  END OF SESSION:  PT End of Session - 05/22/23 1603     Visit Number 5    Date for PT Re-Evaluation 06/26/23    Authorization Type BC/BS    PT Start Time 1433    PT Stop Time 1512    PT Time Calculation (min) 39 min    Activity Tolerance Patient tolerated treatment well    Behavior During Therapy WFL for tasks assessed/performed                 Past Medical History:  Diagnosis Date   Gestational diabetes mellitus (GDM)    Hypertension    Morbid obesity (HCC)    Past Surgical History:  Procedure Laterality Date   ANTERIOR (CYSTOCELE) AND POSTERIOR REPAIR (RECTOCELE) WITH XENFORM GRAFT AND SACROSPINOUS FIXATION     CHOLECYSTECTOMY     TOTAL ABDOMINAL HYSTERECTOMY     Patient Active Problem List   Diagnosis Date Noted   Hyperlipidemia 02/24/2020   Vitamin D deficiency 10/05/2019   Hypertension    Morbid obesity (HCC)    Gestational diabetes mellitus (GDM)     PCP: Philip Aspen, Limmie Patricia, MD  REFERRING PROVIDER: Philip Aspen, Limmie Patricia, MD  REFERRING DIAG: M16.0 (ICD-10-CM) - Bilateral primary osteoarthritis of hip M54.50,G89.29 (ICD-10-CM) - Chronic bilateral low back pain without sciatica  THERAPY DIAG:  Other low back pain  Other abnormalities of gait and mobility  Cramp and spasm  Muscle weakness (generalized)  Rationale for Evaluation and Treatment: Rehabilitation  ONSET DATE: Has had since 2017, but in past 3 months, the pain has started getting worse  SUBJECTIVE:   SUBJECTIVE STATEMENT: Pt felt very good after her first aquatic session.   From Evaluation: Pt reports that pain has been worsening in past 2 years, but in past few months, she has noted that she has been grumpier and has not had the energy that she used to have.  Pain has impacted the relationship that she has with her  granddaughters who are 9 and nearly 4.  Patient states that the pain initially started in her back and right hip, but she is now noticing pain in her left hip, as well.  Patient has a referral pending to Dr Alluisio. Pt notes that she has weak core.  Pt [facility]  Pt reports difficulty with stairs at times and uses step to pattern and reliance on railing.  PERTINENT HISTORY: OA, HTN, Hx of hysterectomy and prolapsed bladder PAIN:  Are you having pain? Yes: NPRS scale: 5/10 Pain location: back and bilateral hips (right hip worse than left) Pain description: aching, sharp Aggravating factors: sitting.  "I can hardly move in the morning when I get out of bed." Relieving factors: Iburpofen and movement  PRECAUTIONS: None  RED FLAGS: Bowel or bladder incontinence: Yes: has a prolapsed bladder    WEIGHT BEARING RESTRICTIONS: No  FALLS:  Has patient fallen in last 6 months? No  LIVING ENVIRONMENT: Lives with: lives with their spouse Lives in: House/apartment Stairs: Yes: Internal: 4 steps; on right going up and External: 3 steps; on right going up Has following equipment at home: None  OCCUPATION: Retired  PLOF: Independent and Leisure: spend time with granddaughters  PATIENT GOALS: To help control the pain and build the muscles up.  NEXT MD VISIT: Pending visit with Dr  Allusio, Dr Ardyth Harps for yearly check up  OBJECTIVE:   DIAGNOSTIC FINDINGS: Pt believes that she had a MRI in 2017 which revealed hip OA  PATIENT SURVEYS:  Eval:  FOTO 52 (projected 63 by visit 12)  COGNITION: Overall cognitive status: Within functional limits for tasks assessed     SENSATION: Reports every once in a while when she gets up in the middle of the night, she has some foot tingling "maybe 3 times a month"  MUSCLE LENGTH: Piriformis tightness noted bilat  POSTURE: rounded shoulders, forward head, and anterior pelvic  tilt  PALPATION: Mild tenderness to palpation along lumbar paraspinals and bilat glutes/piriformis  LOWER EXTREMITY ROM: WFL  LOWER EXTREMITY MMT: Eval: Right hip strength of 4-/5 grossly throughout.  Right quad strength 4/5.  Right hamstring strength of 4-/5 Left hip strength of grossly 4/5.  Left quad strength is 5-/5.  Left hamstring strength is 4/5.  FUNCTIONAL TESTS:  05/04/2023: 5 times sit to stand: 9.25 sec without UE support Timed up and go (TUG): 9.1 sec  GAIT: Distance walked: >100 ft Assistive device utilized: None Level of assistance: Complete Independence Comments: Pt reports that she is able to ambulate as long as she needs to without having increased pain   TODAY'S TREATMENT:     05/22/23:Pt arrives for aquatic physical therapy. Treatment took place in 3.5-5.5 feet of water. Water temperature was 91 degrees F. Pt entered the pool via stairs. Pt requires buoyancy of water for support and to offload joints with strengthening exercises. Seated water bench with 75% submersion Pt performed seated LE AROM exercises 20x in all planes, with concurrent discussion of current status, how she did after last session and current pain. 75% depth water walking 8 lengths with natural arms. VC for posture during forward walking. Hip 3 ways no rest 15x each Bil, small UE assistance for balance. Standing against the wall: anchor tailbone & scapula: Post pelvic tilt 10x 3 sec hold, then hold posistion and perform lat/core press with single buoy UE wt 10x. Vc to engage glutes. Hamstring stretch on second step Bil 3 breaths 3x then standing childs pose 2x 3 breaths. Seated decompression with large noodle behind patient 3 min.  05/15/23: Pt arrives for aquatic physical therapy. Treatment took place in 3.5-5.5 feet of water. Water temperature was. Pt entered the pool via stairs. Pt requires buoyancy of water for support and to offload joints with strengthening exercises. Seated water bench with 75%  submersion Pt performed seated LE AROM exercises 20x in all planes, with concurrent discussion of current status,pain, MD appt and a short AROM routine she could do in bed before getting up in the AM to help with the transition. Verbal education on water principles and how we would use them.  75% depth water walking 6 lengths with natural arms. VC for posture during forward walking. Hip 3 ways no rest 10x each Bil, small UE assistance for balance. Standing against the wall: anchor tailbone & scapula: Post pelvic tilt 5x 3 sec hold, then hold posistion and perform lat/core press with single buoy UE wt 10x. Vc to engage glutes. Hamstring stretch on second step Bil 3 breaths 3x then standing childs pose 2x 3 breaths.       DATE: 05/12/2023  NuStep: Level 3x 6 minutes- PT present to discuss progress Seated hamstring stretch 3x20 seconds  Seated piriformis stretch Sit to stand 2x10 Supine pelvic tilt and TA activation Ball squeezes and hip abuduction with band  Electrical stim to low  back and gluteals with heat x12 minutes   PATIENT EDUCATION:  Education details: Issued HEP Person educated: Patient Education method: Explanation, Demonstration, and Handouts Education comprehension: verbalized understanding and returned demonstration  HOME EXERCISE PROGRAM: Access Code: [email or web address] URL: [email or web address] Date: 05/07/2023 Prepared by: Tresa Endo  Exercises - Seated Piriformis Stretch with Trunk Bend  - 1 x daily - 7 x weekly - 2 reps - 20 sec hold - Seated Hamstring Stretch  - 1 x daily - 7 x weekly - 2 reps - 20 sec hold - Seated Transversus Abdominis Bracing  - 1 x daily - 7 x weekly - 2 sets - 10 reps - 2 sec hold - Squat with Chair Touch  - 1 x daily - 7 x weekly - 2 sets - 10 reps - Supine Lower Trunk Rotation  - 1 x daily - 7 x weekly - 1 sets - 10 reps - Supine Posterior Pelvic Tilt  - 1 x daily - 7 x weekly - 2 sets - 10 reps - Supine Transversus Abdominis Bracing - Hands on  Thighs  - 1 x daily - 7 x weekly - 2 sets - 10 reps - Supine Hip Adduction Isometric with Ball  - 2 x daily - 7 x weekly - 2 sets - 10 reps - Hooklying Clamshell with Resistance  - 2 x daily - 7 x weekly - 2 sets - 10 reps ASSESSMENT:  CLINICAL IMPRESSION:Pt reports tolerating her first aquatic treatment very well, had good pain relief until later that evening when she sat for longer times. Little to no verbal cuing for trunk posture, pt working hard at her awareness regarding her posture. No pain, able to tolerate small increases in work load.  OBJECTIVE IMPAIRMENTS: decreased activity tolerance, difficulty walking, decreased strength, increased muscle spasms, impaired flexibility, postural dysfunction, and pain.   ACTIVITY LIMITATIONS: lifting, bending, and sitting  PARTICIPATION LIMITATIONS: community activity  PERSONAL FACTORS: Time since onset of injury/illness/exacerbation and 1-2 comorbidities: OA, prolapsed bladder  are also affecting patient's functional outcome.   REHAB POTENTIAL: Good  CLINICAL DECISION MAKING: Stable/uncomplicated  EVALUATION COMPLEXITY: Low   GOALS: Goals reviewed with patient? Yes  SHORT TERM GOALS: Target date: 05/29/2023 Pt will be independent with initial HEP. Baseline: Goal status: In progress   2.  Pt will report at least a 30% improvement in symptoms since starting initial evaluation. Baseline:  Goal status: INITIAL   LONG TERM GOALS: Target date: 06/26/2023  Pt will be independent with advanced HEP. Baseline:  Goal status: INITIAL  2.  Patient will increase FOTO to 63 to demonstrate improvements in functional mobility. Baseline: 52 Goal status: INITIAL  3.  Pt to be able to ride in the car/sit for at least 1 hour without increased pain. Baseline:  Goal status: INITIAL  4.  Pt to be able to sit on the floor and stand up without increased pain to allow her to play games with granddaughters. Baseline:  Goal status: INITIAL  5.  Pt  will be able to increase bilat LE strength to at least 4+/5 to allow patient to navigate stairs with reciprocal pattern. Baseline:  Goal status: INITIAL  PLAN:  PT FREQUENCY: 2x/week  PT DURATION: 8 weeks  PLANNED INTERVENTIONS: Therapeutic exercises, Therapeutic activity, Neuromuscular re-education, Balance training, Gait training, Patient/Family education, Self Care, Joint mobilization, Joint manipulation, Stair training, Aquatic Therapy, Dry Needling, Spinal manipulation, Spinal mobilization, Cryotherapy, Moist heat, Taping, Ultrasound, Ionotophoresis 4mg /ml Dexamethasone, Manual therapy, and Re-evaluation  PLAN FOR NEXT SESSION: Assess  aquatic session  Ane Payment, PTA 05/22/23 4:04 PM   [facility] [address] [place], [place] [address] Phone # [phone] Fax [phone]

## 2023-05-22 ENCOUNTER — Ambulatory Visit: Payer: BC Managed Care – PPO | Attending: Internal Medicine | Admitting: Physical Therapy

## 2023-05-22 DIAGNOSIS — M5459 Other low back pain: Secondary | ICD-10-CM | POA: Insufficient documentation

## 2023-05-22 DIAGNOSIS — R252 Cramp and spasm: Secondary | ICD-10-CM | POA: Diagnosis not present

## 2023-05-22 DIAGNOSIS — M6281 Muscle weakness (generalized): Secondary | ICD-10-CM | POA: Insufficient documentation

## 2023-05-22 DIAGNOSIS — R2689 Other abnormalities of gait and mobility: Secondary | ICD-10-CM | POA: Diagnosis not present

## 2023-05-26 ENCOUNTER — Ambulatory Visit (HOSPITAL_BASED_OUTPATIENT_CLINIC_OR_DEPARTMENT_OTHER): Payer: BC Managed Care – PPO | Attending: Internal Medicine | Admitting: Physical Therapy

## 2023-05-26 ENCOUNTER — Encounter (HOSPITAL_BASED_OUTPATIENT_CLINIC_OR_DEPARTMENT_OTHER): Payer: Self-pay | Admitting: Physical Therapy

## 2023-05-26 DIAGNOSIS — R2689 Other abnormalities of gait and mobility: Secondary | ICD-10-CM

## 2023-05-26 DIAGNOSIS — G8929 Other chronic pain: Secondary | ICD-10-CM | POA: Insufficient documentation

## 2023-05-26 DIAGNOSIS — M6281 Muscle weakness (generalized): Secondary | ICD-10-CM

## 2023-05-26 DIAGNOSIS — M545 Low back pain, unspecified: Secondary | ICD-10-CM | POA: Diagnosis not present

## 2023-05-26 DIAGNOSIS — M5459 Other low back pain: Secondary | ICD-10-CM

## 2023-05-26 DIAGNOSIS — M160 Bilateral primary osteoarthritis of hip: Secondary | ICD-10-CM | POA: Diagnosis not present

## 2023-05-26 DIAGNOSIS — R252 Cramp and spasm: Secondary | ICD-10-CM

## 2023-05-26 NOTE — Therapy (Signed)
OUTPATIENT PHYSICAL THERAPY TREATMENT   Patient Name: Kayla Fisher MRN: [id number] DOB:17-May-1961, 62 y.o., female Today's Date: 05/26/2023  END OF SESSION:  PT End of Session - 05/26/23 0816     Visit Number 6    Date for PT Re-Evaluation 06/26/23    Authorization Type BC/BS    PT Start Time 0815    PT Stop Time 0855    PT Time Calculation (min) 40 min    Behavior During Therapy WFL for tasks assessed/performed                 Past Medical History:  Diagnosis Date   Gestational diabetes mellitus (GDM)    Hypertension    Morbid obesity (HCC)    Past Surgical History:  Procedure Laterality Date   ANTERIOR (CYSTOCELE) AND POSTERIOR REPAIR (RECTOCELE) WITH XENFORM GRAFT AND SACROSPINOUS FIXATION     CHOLECYSTECTOMY     TOTAL ABDOMINAL HYSTERECTOMY     Patient Active Problem List   Diagnosis Date Noted   Hyperlipidemia 02/24/2020   Vitamin D deficiency 10/05/2019   Hypertension    Morbid obesity (HCC)    Gestational diabetes mellitus (GDM)     PCP: Philip Aspen, Limmie Patricia, MD  REFERRING PROVIDER: Philip Aspen, Limmie Patricia, MD  REFERRING DIAG: M16.0 (ICD-10-CM) - Bilateral primary osteoarthritis of hip M54.50,G89.29 (ICD-10-CM) - Chronic bilateral low back pain without sciatica  THERAPY DIAG:  Other low back pain  Other abnormalities of gait and mobility  Cramp and spasm  Muscle weakness (generalized)  Rationale for Evaluation and Treatment: Rehabilitation  ONSET DATE: Has had since 2017, but in past 3 months, the pain has started getting worse  SUBJECTIVE:   SUBJECTIVE STATEMENT: Pt reports her pain this weekend was "tremendous"  8-9/10, with no relief from ibuprofen.  "I think I may have overdone it (in the pool)"    From Evaluation: Pt reports that pain has been worsening in past 2 years, but in past few months, she has noted that she has been grumpier and has not had the energy that she used to have.  Pain has impacted the  relationship that she has with her granddaughters who are 9 and nearly 4.  Patient states that the pain initially started in her back and right hip, but she is now noticing pain in her left hip, as well.  Patient has a referral pending to Dr Alluisio. Pt notes that she has weak core.  Pt goes once a week to arthritis exercise class at the senior center.  Pt reports difficulty with stairs at times and uses step to pattern and reliance on railing.  PERTINENT HISTORY: OA, HTN, Hx of hysterectomy and prolapsed bladder PAIN:  Are you having pain? Yes: NPRS scale: 7-8/10 Pain location: back and bilateral hips (right hip worse than left) Pain description: aching, sharp Aggravating factors: sitting.  "I can hardly move in the morning when I get out of bed." Relieving factors: Iburpofen and movement  PRECAUTIONS: None  RED FLAGS: Bowel or bladder incontinence: Yes: has a prolapsed bladder    WEIGHT BEARING RESTRICTIONS: No  FALLS:  Has patient fallen in last 6 months? No  LIVING ENVIRONMENT: Lives with: lives with their spouse Lives in: House/apartment Stairs: Yes: Internal: 4 steps; on right going up and External: 3 steps; on right going up Has following equipment at home: None  OCCUPATION: Retired  PLOF: Independent and Leisure: spend time with granddaughters  PATIENT GOALS: To help control the pain and build the muscles  up.  NEXT MD VISIT: Pending visit with Dr Berton Lan, Dr Ardyth Harps for yearly check up  OBJECTIVE:   DIAGNOSTIC FINDINGS: Pt believes that she had a MRI in 2017 which revealed hip OA  PATIENT SURVEYS:  Eval:  FOTO 52 (projected 63 by visit 12)  COGNITION: Overall cognitive status: Within functional limits for tasks assessed     SENSATION: Reports every once in a while when she gets up in the middle of the night, she has some foot tingling "maybe 3 times a month"  MUSCLE LENGTH: Piriformis tightness noted bilat  POSTURE: rounded shoulders, forward head, and  anterior pelvic tilt  PALPATION: Mild tenderness to palpation along lumbar paraspinals and bilat glutes/piriformis  LOWER EXTREMITY ROM: WFL  LOWER EXTREMITY MMT: Eval: Right hip strength of 4-/5 grossly throughout.  Right quad strength 4/5.  Right hamstring strength of 4-/5 Left hip strength of grossly 4/5.  Left quad strength is 5-/5.  Left hamstring strength is 4/5.  FUNCTIONAL TESTS:  05/04/2023: 5 times sit to stand: 9.25 sec without UE support Timed up and go (TUG): 9.1 sec  GAIT: Distance walked: >100 ft Assistive device utilized: None Level of assistance: Complete Independence Comments: Pt reports that she is able to ambulate as long as she needs to without having increased pain   TODAY'S TREATMENT:    05/26/23 Pt seen for aquatic therapy today.  Treatment took place in water 3.5-4.75 ft in depth at the [facility] pool. Temp of water was 91.  Pt entered/exited the pool via stairs independently with bilat rail. * unsupported:  walking forward/ backward, side stepping - cues for step length and vertical trunk * side stepping with rainbow hand floats, arm addct / abdct * farmers carry with single/bilat rainbow hand floats under water  * L stretch at wall with slight bend in knees, feet on wall * straddling noodle without UE support:  cycling, cross country ski, reverse jumping jacks  * L stretch repeated * seated on 3rd step from bottom: fig 4 piriformis stretch (sitting tall, then pulling knee towards opp shoulder- painful on R)   05/22/23:Pt arrives for aquatic physical therapy. Treatment took place in 3.5-5.5 feet of water. Water temperature was 91 degrees F. Pt entered the pool via stairs. Pt requires buoyancy of water for support and to offload joints with strengthening exercises. Seated water bench with 75% submersion Pt performed seated LE AROM exercises 20x in all planes, with concurrent discussion of current status, how she did after last session and current  pain. 75% depth water walking 8 lengths with natural arms. VC for posture during forward walking. Hip 3 ways no rest 15x each Bil, small UE assistance for balance. Standing against the wall: anchor tailbone & scapula: Post pelvic tilt 10x 3 sec hold, then hold posistion and perform lat/core press with single buoy UE wt 10x. Vc to engage glutes. Hamstring stretch on second step Bil 3 breaths 3x then standing childs pose 2x 3 breaths. Seated decompression with large noodle behind patient 3 min.  05/15/23: Pt arrives for aquatic physical therapy. Treatment took place in 3.5-5.5 feet of water. Water temperature was. Pt entered the pool via stairs. Pt requires buoyancy of water for support and to offload joints with strengthening exercises. Seated water bench with 75% submersion Pt performed seated LE AROM exercises 20x in all planes, with concurrent discussion of current status,pain, MD appt and a short AROM routine she could do in bed before getting up in the AM to help with  the transition. Verbal education on water principles and how we would use them.  75% depth water walking 6 lengths with natural arms. VC for posture during forward walking. Hip 3 ways no rest 10x each Bil, small UE assistance for balance. Standing against the wall: anchor tailbone & scapula: Post pelvic tilt 5x 3 sec hold, then hold posistion and perform lat/core press with single buoy UE wt 10x. Vc to engage glutes. Hamstring stretch on second step Bil 3 breaths 3x then standing childs pose 2x 3 breaths.       DATE: 05/12/2023  NuStep: Level 3x 6 minutes- PT present to discuss progress Seated hamstring stretch 3x20 seconds  Seated piriformis stretch Sit to stand 2x10 Supine pelvic tilt and TA activation Ball squeezes and hip abuduction with band  Electrical stim to low back and gluteals with heat x12 minutes   PATIENT EDUCATION:  Education details: Issued HEP Person educated: Patient Education method: Programmer, multimedia, Facilities manager,  and Handouts Education comprehension: verbalized understanding and returned demonstration  HOME EXERCISE PROGRAM: Access Code: [email or web address] URL: [email or web address] Date: 05/07/2023 Prepared by: Tresa Endo  Exercises - Seated Piriformis Stretch with Trunk Bend  - 1 x daily - 7 x weekly - 2 reps - 20 sec hold - Seated Hamstring Stretch  - 1 x daily - 7 x weekly - 2 reps - 20 sec hold - Seated Transversus Abdominis Bracing  - 1 x daily - 7 x weekly - 2 sets - 10 reps - 2 sec hold - Squat with Chair Touch  - 1 x daily - 7 x weekly - 2 sets - 10 reps - Supine Lower Trunk Rotation  - 1 x daily - 7 x weekly - 1 sets - 10 reps - Supine Posterior Pelvic Tilt  - 1 x daily - 7 x weekly - 2 sets - 10 reps - Supine Transversus Abdominis Bracing - Hands on Thighs  - 1 x daily - 7 x weekly - 2 sets - 10 reps - Supine Hip Adduction Isometric with Ball  - 2 x daily - 7 x weekly - 2 sets - 10 reps - Hooklying Clamshell with Resistance  - 2 x daily - 7 x weekly - 2 sets - 10 reps ASSESSMENT:  CLINICAL IMPRESSION:Pt reported gradual reduction of pain in back / hips to 2/10. Minor verbal cuing for more vertical trunk posture.  Given instruction to trial hooklying version of piriformis stretch (demo given) due to pain with seated version of this stretch. Began discussion of pool options after d/c if she gets benefit from water exercise. Goals are ongoing.   OBJECTIVE IMPAIRMENTS: decreased activity tolerance, difficulty walking, decreased strength, increased muscle spasms, impaired flexibility, postural dysfunction, and pain.   ACTIVITY LIMITATIONS: lifting, bending, and sitting  PARTICIPATION LIMITATIONS: community activity  PERSONAL FACTORS: Time since onset of injury/illness/exacerbation and 1-2 comorbidities: OA, prolapsed bladder  are also affecting patient's functional outcome.   REHAB POTENTIAL: Good  CLINICAL DECISION MAKING: Stable/uncomplicated  EVALUATION COMPLEXITY:  Low   GOALS: Goals reviewed with patient? Yes  SHORT TERM GOALS: Target date: 05/29/2023 Pt will be independent with initial HEP. Baseline: Goal status: In progress   2.  Pt will report at least a 30% improvement in symptoms since starting initial evaluation. Baseline:  Goal status: INITIAL   LONG TERM GOALS: Target date: 06/26/2023  Pt will be independent with advanced HEP. Baseline:  Goal status: INITIAL  2.  Patient will increase FOTO to 63 to demonstrate improvements in functional mobility. Baseline: 52  Goal status: INITIAL  3.  Pt to be able to ride in the car/sit for at least 1 hour without increased pain. Baseline:  Goal status: INITIAL  4.  Pt to be able to sit on the floor and stand up without increased pain to allow her to play games with granddaughters. Baseline:  Goal status: INITIAL  5.  Pt will be able to increase bilat LE strength to at least 4+/5 to allow patient to navigate stairs with reciprocal pattern. Baseline:  Goal status: INITIAL  PLAN:  PT FREQUENCY: 2x/week  PT DURATION: 8 weeks  PLANNED INTERVENTIONS: Therapeutic exercises, Therapeutic activity, Neuromuscular re-education, Balance training, Gait training, Patient/Family education, Self Care, Joint mobilization, Joint manipulation, Stair training, Aquatic Therapy, Dry Needling, Spinal manipulation, Spinal mobilization, Cryotherapy, Moist heat, Taping, Ultrasound, Ionotophoresis 4mg /ml Dexamethasone, Manual therapy, and Re-evaluation  PLAN FOR NEXT SESSION: Assess aquatic session  Mayer Camel, PTA 05/26/23 9:10 AM [facility] [address] [place], [place], [address] Phone: [phone]   Fax:  [phone]

## 2023-05-28 ENCOUNTER — Ambulatory Visit: Payer: BC Managed Care – PPO

## 2023-05-28 DIAGNOSIS — R2689 Other abnormalities of gait and mobility: Secondary | ICD-10-CM

## 2023-05-28 DIAGNOSIS — M5459 Other low back pain: Secondary | ICD-10-CM

## 2023-05-28 DIAGNOSIS — R252 Cramp and spasm: Secondary | ICD-10-CM | POA: Diagnosis not present

## 2023-05-28 DIAGNOSIS — M6281 Muscle weakness (generalized): Secondary | ICD-10-CM | POA: Diagnosis not present

## 2023-05-28 NOTE — Therapy (Signed)
OUTPATIENT PHYSICAL THERAPY TREATMENT   Patient Name: Fynnlee Sutphen MRN: [id number] DOB:[birth year], 61 y.o., female Today's Date: 05/28/2023  END OF SESSION:  PT End of Session - 05/28/23 1052     Visit Number 7    Date for PT Re-Evaluation 06/26/23    Authorization Type BC/BS    PT Start Time 0932    PT Stop Time 1011    PT Time Calculation (min) 39 min    Activity Tolerance Patient tolerated treatment well    Behavior During Therapy [facility] for tasks assessed/performed                  Past Medical History:  Diagnosis Date   Gestational diabetes mellitus (GDM)    Hypertension    Morbid obesity (HCC)    Past Surgical History:  Procedure Laterality Date   ANTERIOR (CYSTOCELE) AND POSTERIOR REPAIR (RECTOCELE) WITH XENFORM GRAFT AND SACROSPINOUS FIXATION     CHOLECYSTECTOMY     TOTAL ABDOMINAL HYSTERECTOMY     Patient Active Problem List   Diagnosis Date Noted   Hyperlipidemia 02/24/2020   Vitamin D deficiency 10/05/2019   Hypertension    Morbid obesity (HCC)    Gestational diabetes mellitus (GDM)     PCP: Philip Aspen, Limmie Patricia, MD  REFERRING PROVIDER: Philip Aspen, Limmie Patricia, MD  REFERRING DIAG: M16.0 (ICD-10-CM) - Bilateral primary osteoarthritis of hip M54.50,G89.29 (ICD-10-CM) - Chronic bilateral low back pain without sciatica  THERAPY DIAG:  Other low back pain  Other abnormalities of gait and mobility  Muscle weakness (generalized)  Rationale for Evaluation and Treatment: Rehabilitation  ONSET DATE: Has had since 2017, but in past 3 months, the pain has started getting worse  SUBJECTIVE:   SUBJECTIVE STATEMENT:  I am feeling better now since the flare-up in the pool.  I am doing the exercises.  Seated figure 4 is more painful so I do it on my back.  No change in pain since the start of care aside from feeling less pain.    From Evaluation: Pt reports that pain has been worsening in past 2 years, but in past few months, she has  noted that she has been grumpier and has not had the energy that she used to have.  Pain has impacted the relationship that she has with her granddaughters who are 9 and nearly 4.  Patient states that the pain initially started in her back and right hip, but she is now noticing pain in her left hip, as well.  Patient has a referral pending to Dr Alluisio. Pt notes that she has weak core.  Pt [facility] a [facility]  Pt reports difficulty with stairs at times and uses step to pattern and reliance on railing.  PERTINENT HISTORY: OA, HTN, Hx of hysterectomy and prolapsed bladder PAIN:  Are you having pain? Yes: NPRS scale: 4-5/10 Pain location: back and bilateral hips (right hip worse than left) Pain description: aching, sharp Aggravating factors: sitting.  "I can hardly move in the morning when I get out of bed." Relieving factors: Iburpofen and movement  PRECAUTIONS: None  RED FLAGS: Bowel or bladder incontinence: Yes: has a prolapsed bladder    WEIGHT BEARING RESTRICTIONS: No  FALLS:  Has patient fallen in last 6 months? No  LIVING ENVIRONMENT: Lives with: lives with their spouse Lives in: House/apartment Stairs: Yes: Internal: 4 steps; on right going up and External: 3 steps; on right going up Has following equipment  at home: None  OCCUPATION: Retired  PLOF: Independent and Leisure: spend time with granddaughters  PATIENT GOALS: To help control the pain and build the muscles up.  NEXT MD VISIT: Pending visit with Dr Berton Lan, Dr Ardyth Harps for yearly check up  OBJECTIVE:   DIAGNOSTIC FINDINGS: Pt believes that she had a MRI in 2017 which revealed hip OA  PATIENT SURVEYS:  Eval:  FOTO 52 (projected 63 by visit 12)  COGNITION: Overall cognitive status: Within functional limits for tasks assessed     SENSATION: Reports every once in a while when she gets up in the middle of the night, she has some foot tingling "maybe 3 times a  month"  MUSCLE LENGTH: Piriformis tightness noted bilat  POSTURE: rounded shoulders, forward head, and anterior pelvic tilt  PALPATION: Mild tenderness to palpation along lumbar paraspinals and bilat glutes/piriformis  LOWER EXTREMITY ROM: WFL  LOWER EXTREMITY MMT: Eval: Right hip strength of 4-/5 grossly throughout.  Right quad strength 4/5.  Right hamstring strength of 4-/5 Left hip strength of grossly 4/5.  Left quad strength is 5-/5.  Left hamstring strength is 4/5.  FUNCTIONAL TESTS:  05/04/2023: 5 times sit to stand: 9.25 sec without UE support Timed up and go (TUG): 9.1 sec  GAIT: Distance walked: >100 ft Assistive device utilized: None Level of assistance: Complete Independence Comments: Pt reports that she is able to ambulate as long as she needs to without having increased pain   TODAY'S TREATMENT:    DATE: 05/28/2023  NuStep: Level 3x 6 minutes- PT present to discuss progress Seated hamstring stretch 3x20 seconds  Sit to stand 2x10 added 5# kettle bell  Supine pelvic tilt and TA activation Ball squeezes and hip abuduction with band Supine figure 4 and diagonal knee to chest  Sidelying clams x10 each Supine isometrics: opp arm/leg 05/26/23 Pt seen for aquatic therapy today.  Treatment took place in water 3.5-4.75 ft in depth at the [facility] pool. Temp of water was 91.  Pt entered/exited the pool via stairs independently with bilat rail. * unsupported:  walking forward/ backward, side stepping - cues for step length and vertical trunk * side stepping with rainbow hand floats, arm addct / abdct * farmers carry with single/bilat rainbow hand floats under water  * L stretch at wall with slight bend in knees, feet on wall * straddling noodle without UE support:  cycling, cross country ski, reverse jumping jacks  * L stretch repeated * seated on 3rd step from bottom: fig 4 piriformis stretch (sitting tall, then pulling knee towards opp shoulder- painful on  R)   05/22/23:Pt arrives for aquatic physical therapy. Treatment took place in 3.5-5.5 feet of water. Water temperature was 91 degrees F. Pt entered the pool via stairs. Pt requires buoyancy of water for support and to offload joints with strengthening exercises. Seated water bench with 75% submersion Pt performed seated LE AROM exercises 20x in all planes, with concurrent discussion of current status, how she did after last session and current pain. 75% depth water walking 8 lengths with natural arms. VC for posture during forward walking. Hip 3 ways no rest 15x each Bil, small UE assistance for balance. Standing against the wall: anchor tailbone & scapula: Post pelvic tilt 10x 3 sec hold, then hold posistion and perform lat/core press with single buoy UE wt 10x. Vc to engage glutes. Hamstring stretch on second step Bil 3 breaths 3x then standing childs pose 2x 3 breaths. Seated decompression with large noodle behind  patient 3 min.   PATIENT EDUCATION:  Education details: Issued HEP Person educated: Patient Education method: Explanation, Demonstration, and Handouts Education comprehension: verbalized understanding and returned demonstration  HOME EXERCISE PROGRAM: Access Code: [email or web address] URL: [email or web address] Date: 05/07/2023 Prepared by: Tresa Endo  Exercises - Seated Piriformis Stretch with Trunk Bend  - 1 x daily - 7 x weekly - 2 reps - 20 sec hold - Seated Hamstring Stretch  - 1 x daily - 7 x weekly - 2 reps - 20 sec hold - Seated Transversus Abdominis Bracing  - 1 x daily - 7 x weekly - 2 sets - 10 reps - 2 sec hold - Squat with Chair Touch  - 1 x daily - 7 x weekly - 2 sets - 10 reps - Supine Lower Trunk Rotation  - 1 x daily - 7 x weekly - 1 sets - 10 reps - Supine Posterior Pelvic Tilt  - 1 x daily - 7 x weekly - 2 sets - 10 reps - Supine Transversus Abdominis Bracing - Hands on Thighs  - 1 x daily - 7 x weekly - 2 sets - 10 reps - Supine Hip Adduction Isometric with  Ball  - 2 x daily - 7 x weekly - 2 sets - 10 reps - Hooklying Clamshell with Resistance  - 2 x daily - 7 x weekly - 2 sets - 10 reps ASSESSMENT:  CLINICAL IMPRESSION:  Pt denies any change in her symptoms. She saw MD and he feels it is her SI joint and offered injection.  She declined injection due to fear of needles.  PT discussed benefits of dry needling for tissue mobility and release of trigger points and she declined this treatment.  Pt with pain with seated Rt figure 4 stretch and modifications were offered.  PT educated regarding the purpose of each exercise and how it relates to her pain.  PT provided verbal and tactile cues for alignment.  Patient will benefit from skilled PT to address the below impairments and improve overall function.   OBJECTIVE IMPAIRMENTS: decreased activity tolerance, difficulty walking, decreased strength, increased muscle spasms, impaired flexibility, postural dysfunction, and pain.   ACTIVITY LIMITATIONS: lifting, bending, and sitting  PARTICIPATION LIMITATIONS: community activity  PERSONAL FACTORS: Time since onset of injury/illness/exacerbation and 1-2 comorbidities: OA, prolapsed bladder  are also affecting patient's functional outcome.   REHAB POTENTIAL: Good  CLINICAL DECISION MAKING: Stable/uncomplicated  EVALUATION COMPLEXITY: Low   GOALS: Goals reviewed with patient? Yes  SHORT TERM GOALS: Target date: 05/29/2023 Pt will be independent with initial HEP. Baseline: Goal status: MET (05/28/23)  2.  Pt will report at least a 30% improvement in symptoms since starting initial evaluation. Baseline:  0% (05/28/23) Goal status: in progress    LONG TERM GOALS: Target date: 06/26/2023  Pt will be independent with advanced HEP. Baseline:  Goal status: INITIAL  2.  Patient will increase FOTO to 63 to demonstrate improvements in functional mobility. Baseline: 52 Goal status: INITIAL  3.  Pt to be able to ride in the car/sit for at least 1 hour  without increased pain. Baseline: havent tried this (05/28/23) Goal status: INITIAL  4.  Pt to be able to sit on the floor and stand up without increased pain to allow her to play games with granddaughters. Baseline:  Goal status: INITIAL  5.  Pt will be able to increase bilat LE strength to at least 4+/5 to allow patient to navigate stairs with reciprocal pattern. Baseline:  Goal status: INITIAL  PLAN:  PT FREQUENCY: 2x/week  PT DURATION: 8 weeks  PLANNED INTERVENTIONS: Therapeutic exercises, Therapeutic activity, Neuromuscular re-education, Balance training, Gait training, Patient/Family education, Self Care, Joint mobilization, Joint manipulation, Stair training, Aquatic Therapy, Dry Needling, Spinal manipulation, Spinal mobilization, Cryotherapy, Moist heat, Taping, Ultrasound, Ionotophoresis 4mg /ml Dexamethasone, Manual therapy, and Re-evaluation  PLAN FOR NEXT SESSION: Continue aquatics, postural strength and flexibility.  Consider D/C to HEP if no change in function or pain.   Lorrene Reid, PT 05/28/23 10:55 AM

## 2023-06-01 ENCOUNTER — Ambulatory Visit (HOSPITAL_BASED_OUTPATIENT_CLINIC_OR_DEPARTMENT_OTHER): Payer: BC Managed Care – PPO | Admitting: Physical Therapy

## 2023-06-01 ENCOUNTER — Encounter (HOSPITAL_BASED_OUTPATIENT_CLINIC_OR_DEPARTMENT_OTHER): Payer: Self-pay | Admitting: Physical Therapy

## 2023-06-01 DIAGNOSIS — R2689 Other abnormalities of gait and mobility: Secondary | ICD-10-CM

## 2023-06-01 DIAGNOSIS — M6281 Muscle weakness (generalized): Secondary | ICD-10-CM

## 2023-06-01 DIAGNOSIS — M160 Bilateral primary osteoarthritis of hip: Secondary | ICD-10-CM | POA: Diagnosis not present

## 2023-06-01 DIAGNOSIS — M545 Low back pain, unspecified: Secondary | ICD-10-CM | POA: Diagnosis not present

## 2023-06-01 DIAGNOSIS — G8929 Other chronic pain: Secondary | ICD-10-CM | POA: Diagnosis not present

## 2023-06-01 DIAGNOSIS — R252 Cramp and spasm: Secondary | ICD-10-CM

## 2023-06-01 DIAGNOSIS — M5459 Other low back pain: Secondary | ICD-10-CM

## 2023-06-01 NOTE — Therapy (Signed)
OUTPATIENT PHYSICAL THERAPY TREATMENT   Patient Name: Kayla Fisher MRN: [id number] DOB:03/20/1961, 62 y.o., female Today's Date: 06/01/2023  END OF SESSION:  PT End of Session - 06/01/23 1035     Visit Number 8    Date for PT Re-Evaluation 06/26/23    Authorization Type BC/BS    PT Start Time 1031    PT Stop Time 1110    PT Time Calculation (min) 39 min    Activity Tolerance Patient tolerated treatment well    Behavior During Therapy WFL for tasks assessed/performed                  Past Medical History:  Diagnosis Date   Gestational diabetes mellitus (GDM)    Hypertension    Morbid obesity (HCC)    Past Surgical History:  Procedure Laterality Date   ANTERIOR (CYSTOCELE) AND POSTERIOR REPAIR (RECTOCELE) WITH XENFORM GRAFT AND SACROSPINOUS FIXATION     CHOLECYSTECTOMY     TOTAL ABDOMINAL HYSTERECTOMY     Patient Active Problem List   Diagnosis Date Noted   Hyperlipidemia 02/24/2020   Vitamin D deficiency 10/05/2019   Hypertension    Morbid obesity (HCC)    Gestational diabetes mellitus (GDM)     PCP: Philip Aspen, Limmie Patricia, MD  REFERRING PROVIDER: Philip Aspen, Limmie Patricia, MD  REFERRING DIAG: M16.0 (ICD-10-CM) - Bilateral primary osteoarthritis of hip M54.50,G89.29 (ICD-10-CM) - Chronic bilateral low back pain without sciatica  THERAPY DIAG:  Other low back pain  Other abnormalities of gait and mobility  Muscle weakness (generalized)  Cramp and spasm  Rationale for Evaluation and Treatment: Rehabilitation  ONSET DATE: Has had since 2017, but in past 3 months, the pain has started getting worse  SUBJECTIVE:   SUBJECTIVE STATEMENT:  Pt reports she has been using ice on her back and this has been helping.   "I feel looser".   PERTINENT HISTORY: OA, HTN, Hx of hysterectomy and prolapsed bladder PAIN:  Are you having pain? Yes: NPRS scale: 5.5/10 Pain location: back and bilateral hips (right hip worse than left) Pain  description: aching, sharp Aggravating factors: sitting.  "I can hardly move in the morning when I get out of bed." Relieving factors: Iburpofen and movement  PRECAUTIONS: None  RED FLAGS: Bowel or bladder incontinence: Yes: has a prolapsed bladder    WEIGHT BEARING RESTRICTIONS: No  FALLS:  Has patient fallen in last 6 months? No  LIVING ENVIRONMENT: Lives with: lives with their spouse Lives in: House/apartment Stairs: Yes: Internal: 4 steps; on right going up and External: 3 steps; on right going up Has following equipment at home: None  OCCUPATION: Retired  PLOF: Independent and Leisure: spend time with granddaughters  PATIENT GOALS: To help control the pain and build the muscles up.  NEXT MD VISIT: Pending visit with Dr Berton Lan, Dr Ardyth Harps for yearly check up  OBJECTIVE:   DIAGNOSTIC FINDINGS: Pt believes that she had a MRI in 2017 which revealed hip OA  PATIENT SURVEYS:  Eval:  FOTO 52 (projected 63 by visit 12)  COGNITION: Overall cognitive status: Within functional limits for tasks assessed     SENSATION: Reports every once in a while when she gets up in the middle of the night, she has some foot tingling "maybe 3 times a month"  MUSCLE LENGTH: Piriformis tightness noted bilat  POSTURE: rounded shoulders, forward head, and anterior pelvic tilt  PALPATION: Mild tenderness to palpation along lumbar paraspinals and bilat glutes/piriformis  LOWER EXTREMITY ROM: [facility]  LOWER EXTREMITY MMT: Eval: Right hip strength of 4-/5 grossly throughout.  Right quad strength 4/5.  Right hamstring strength of 4-/5 Left hip strength of grossly 4/5.  Left quad strength is 5-/5.  Left hamstring strength is 4/5.  FUNCTIONAL TESTS:  05/04/2023: 5 times sit to stand: 9.25 sec without UE support Timed up and go (TUG): 9.1 sec  GAIT: Distance walked: >100 ft Assistive device utilized: None Level of assistance: Complete Independence Comments: Pt reports that she is able to  ambulate as long as she needs to without having increased pain   TODAY'S TREATMENT:    DATE:  06/01/23 Pt seen for aquatic therapy today.  Treatment took place in water 3.5-4.75 ft in depth at the [facility] pool. Temp of water was 91.  Pt entered/exited the pool via stairs independently with bilat rail. * unsupported:  walking forward/ backward with reciprocal arm swing * side stepping with rainbow hand floats, arm addct / abdct * farmers carry with bilat rainbow hand floats under water x 2 laps  * L stretch at wall with slight bend in knees, feet on wall * TrA set with short blue noodle pull down x 5; repeated with solid noodle x 10 ( standard stance)  * UE on wall:  straight leg hip circles "draw circle with toes on ground" CW, CCW x 10 each  * return to marching with reciprocal arm swing * plank position with hands on bench, then with alternating hip ext x 5, slow mountain climbers, fire hydrants x 10 each LE  * straddling noodle without UE support:  cycling, reverse jumping jacks  * L stretch repeated  *once dried off: applied 3 -I strips in star pattern over Rt lateral hip with 20% stretch     05/28/2023  NuStep: Level 3x 6 minutes- PT present to discuss progress Seated hamstring stretch 3x20 seconds  Sit to stand 2x10 added 5# kettle bell  Supine pelvic tilt and TA activation Ball squeezes and hip abuduction with band Supine figure 4 and diagonal knee to chest  Sidelying clams x10 each Supine isometrics: opp arm/leg 05/26/23 Pt seen for aquatic therapy today.  Treatment took place in water 3.5-4.75 ft in depth at the [facility] pool. Temp of water was 91.  Pt entered/exited the pool via stairs independently with bilat rail. * unsupported:  walking forward/ backward, side stepping - cues for step length and vertical trunk * side stepping with rainbow hand floats, arm addct / abdct * farmers carry with single/bilat rainbow hand floats under water  * L stretch  at wall with slight bend in knees, feet on wall * straddling noodle without UE support:  cycling, cross country ski, reverse jumping jacks  * L stretch repeated * seated on 3rd step from bottom: fig 4 piriformis stretch (sitting tall, then pulling knee towards opp shoulder- painful on R)   05/22/23:Pt arrives for aquatic physical therapy. Treatment took place in 3.5-5.5 feet of water. Water temperature was 91 degrees F. Pt entered the pool via stairs. Pt requires buoyancy of water for support and to offload joints with strengthening exercises. Seated water bench with 75% submersion Pt performed seated LE AROM exercises 20x in all planes, with concurrent discussion of current status, how she did after last session and current pain. 75% depth water walking 8 lengths with natural arms. VC for posture during forward walking. Hip 3 ways no rest 15x each Bil, small UE assistance for balance. Standing against the wall: anchor tailbone &  scapula: Post pelvic tilt 10x 3 sec hold, then hold posistion and perform lat/core press with single buoy UE wt 10x. Vc to engage glutes. Hamstring stretch on second step Bil 3 breaths 3x then standing childs pose 2x 3 breaths. Seated decompression with large noodle behind patient 3 min.   PATIENT EDUCATION:  Education details: kinesiology tape rationale, and safe care removal  Person educated: Patient Education method: Explanation, Demonstration Education comprehension: verbalized understanding and returned demonstration  HOME EXERCISE PROGRAM: Access Code: [email or web address] URL: [email or web address] Date: 05/07/2023 Prepared by: Tresa Endo  Exercises - Seated Piriformis Stretch with Trunk Bend  - 1 x daily - 7 x weekly - 2 reps - 20 sec hold - Seated Hamstring Stretch  - 1 x daily - 7 x weekly - 2 reps - 20 sec hold - Seated Transversus Abdominis Bracing  - 1 x daily - 7 x weekly - 2 sets - 10 reps - 2 sec hold - Squat with Chair Touch  - 1 x daily - 7 x weekly -  2 sets - 10 reps - Supine Lower Trunk Rotation  - 1 x daily - 7 x weekly - 1 sets - 10 reps - Supine Posterior Pelvic Tilt  - 1 x daily - 7 x weekly - 2 sets - 10 reps - Supine Transversus Abdominis Bracing - Hands on Thighs  - 1 x daily - 7 x weekly - 2 sets - 10 reps - Supine Hip Adduction Isometric with Ball  - 2 x daily - 7 x weekly - 2 sets - 10 reps - Hooklying Clamshell with Resistance  - 2 x daily - 7 x weekly - 2 sets - 10 reps   ASSESSMENT:  CLINICAL IMPRESSION:  Pt tolerating progression of aquatic exercises well, without increase in symptoms.   Trial of ktape over Rt hip at sensitive area, to decompress tissue, and increase proprioception. Pt given instructions of safe removal and verbalized understanding.  Patient will benefit from skilled PT to address the below impairments and improve overall function. Pt making gradual progress towards goals.   OBJECTIVE IMPAIRMENTS: decreased activity tolerance, difficulty walking, decreased strength, increased muscle spasms, impaired flexibility, postural dysfunction, and pain.   ACTIVITY LIMITATIONS: lifting, bending, and sitting  PARTICIPATION LIMITATIONS: community activity  PERSONAL FACTORS: Time since onset of injury/illness/exacerbation and 1-2 comorbidities: OA, prolapsed bladder  are also affecting patient's functional outcome.   REHAB POTENTIAL: Good  CLINICAL DECISION MAKING: Stable/uncomplicated  EVALUATION COMPLEXITY: Low   GOALS: Goals reviewed with patient? Yes  SHORT TERM GOALS: Target date: 05/29/2023 Pt will be independent with initial HEP. Baseline: Goal status: MET (05/28/23)  2.  Pt will report at least a 30% improvement in symptoms since starting initial evaluation. Baseline:  0% (05/28/23) Goal status: in progress    LONG TERM GOALS: Target date: 06/26/2023  Pt will be independent with advanced HEP. Baseline:  Goal status: INITIAL  2.  Patient will increase FOTO to 63 to demonstrate improvements in  functional mobility. Baseline: 52 Goal status: INITIAL  3.  Pt to be able to ride in the car/sit for at least 1 hour without increased pain. Baseline: havent tried this (05/28/23) Goal status: INITIAL  4.  Pt to be able to sit on the floor and stand up without increased pain to allow her to play games with granddaughters. Baseline:  Goal status: INITIAL  5.  Pt will be able to increase bilat LE strength to at least 4+/5 to allow patient to navigate stairs with  reciprocal pattern. Baseline:  Goal status: INITIAL  PLAN:  PT FREQUENCY: 2x/week  PT DURATION: 8 weeks  PLANNED INTERVENTIONS: Therapeutic exercises, Therapeutic activity, Neuromuscular re-education, Balance training, Gait training, Patient/Family education, Self Care, Joint mobilization, Joint manipulation, Stair training, Aquatic Therapy, Dry Needling, Spinal manipulation, Spinal mobilization, Cryotherapy, Moist heat, Taping, Ultrasound, Ionotophoresis 4mg /ml Dexamethasone, Manual therapy, and Re-evaluation  PLAN FOR NEXT SESSION: Continue aquatics, postural strength and flexibility.  Consider D/C to HEP if no change in function or pain.   Mayer Camel, PTA 06/01/23 1:20 PM [facility] [address] [place], [place], [address] Phone: [phone]   Fax:  [phone]

## 2023-06-04 ENCOUNTER — Ambulatory Visit: Payer: BC Managed Care – PPO | Admitting: Rehabilitative and Restorative Service Providers"

## 2023-06-04 ENCOUNTER — Encounter: Payer: Self-pay | Admitting: Rehabilitative and Restorative Service Providers"

## 2023-06-04 DIAGNOSIS — R2689 Other abnormalities of gait and mobility: Secondary | ICD-10-CM

## 2023-06-04 DIAGNOSIS — M6281 Muscle weakness (generalized): Secondary | ICD-10-CM | POA: Diagnosis not present

## 2023-06-04 DIAGNOSIS — R252 Cramp and spasm: Secondary | ICD-10-CM | POA: Diagnosis not present

## 2023-06-04 DIAGNOSIS — M5459 Other low back pain: Secondary | ICD-10-CM

## 2023-06-04 NOTE — Therapy (Signed)
OUTPATIENT PHYSICAL THERAPY TREATMENT   Patient Name: Kayla Fisher MRN: [id number] DOB:1961/04/24, 62 y.o., female Today's Date: 06/04/2023  END OF SESSION:  PT End of Session - 06/04/23 0932     Visit Number 9    Date for PT Re-Evaluation 06/26/23    Authorization Type BC/BS    PT Start Time 0930    PT Stop Time 1010    PT Time Calculation (min) 40 min    Activity Tolerance Patient tolerated treatment well    Behavior During Therapy WFL for tasks assessed/performed                  Past Medical History:  Diagnosis Date   Gestational diabetes mellitus (GDM)    Hypertension    Morbid obesity (HCC)    Past Surgical History:  Procedure Laterality Date   ANTERIOR (CYSTOCELE) AND POSTERIOR REPAIR (RECTOCELE) WITH XENFORM GRAFT AND SACROSPINOUS FIXATION     CHOLECYSTECTOMY     TOTAL ABDOMINAL HYSTERECTOMY     Patient Active Problem List   Diagnosis Date Noted   Hyperlipidemia 02/24/2020   Vitamin D deficiency 10/05/2019   Hypertension    Morbid obesity (HCC)    Gestational diabetes mellitus (GDM)     PCP: Philip Aspen, Limmie Patricia, MD  REFERRING PROVIDER: Philip Aspen, Limmie Patricia, MD  REFERRING DIAG: M16.0 (ICD-10-CM) - Bilateral primary osteoarthritis of hip M54.50,G89.29 (ICD-10-CM) - Chronic bilateral low back pain without sciatica  THERAPY DIAG:  Other low back pain  Other abnormalities of gait and mobility  Muscle weakness (generalized)  Cramp and spasm  Rationale for Evaluation and Treatment: Rehabilitation  ONSET DATE: Has had since 2017, but in past 3 months, the pain has started getting worse  SUBJECTIVE:   SUBJECTIVE STATEMENT:  "I feel like I've finally turned the corner."  Pt reports feeling 50-55% better since initial evaluation.  PERTINENT HISTORY: OA, HTN, Hx of hysterectomy and prolapsed bladder PAIN:  Are you having pain? Yes: NPRS scale: 4/10 Pain location: back and bilateral hips (right hip worse than  left) Pain description: aching, sharp Aggravating factors: sitting.  "I can hardly move in the morning when I get out of bed." Relieving factors: Iburpofen and movement  PRECAUTIONS: None  RED FLAGS: Bowel or bladder incontinence: Yes: has a prolapsed bladder    WEIGHT BEARING RESTRICTIONS: No  FALLS:  Has patient fallen in last 6 months? No  LIVING ENVIRONMENT: Lives with: lives with their spouse Lives in: House/apartment Stairs: Yes: Internal: 4 steps; on right going up and External: 3 steps; on right going up Has following equipment at home: None  OCCUPATION: Retired  PLOF: Independent and Leisure: spend time with granddaughters  PATIENT GOALS: To help control the pain and build the muscles up.  NEXT MD VISIT: Pending visit with Dr Berton Lan, Dr Ardyth Harps for yearly check up  OBJECTIVE:   DIAGNOSTIC FINDINGS: Pt believes that she had a MRI in 2017 which revealed hip OA  PATIENT SURVEYS:  Eval:  FOTO 52 (projected 63 by visit 12) 06/04/2023:  FOTO 75  COGNITION: Overall cognitive status: Within functional limits for tasks assessed     SENSATION: Reports every once in a while when she gets up in the middle of the night, she has some foot tingling "maybe 3 times a month"  MUSCLE LENGTH: Piriformis tightness noted bilat  POSTURE: rounded shoulders, forward head, and anterior pelvic tilt  PALPATION: Mild tenderness to palpation along lumbar paraspinals and bilat glutes/piriformis  LOWER EXTREMITY ROM: [facility]  LOWER EXTREMITY MMT: Eval: Right hip strength of 4-/5 grossly throughout.  Right quad strength 4/5.  Right hamstring strength of 4-/5 Left hip strength of grossly 4/5.  Left quad strength is 5-/5.  Left hamstring strength is 4/5.  FUNCTIONAL TESTS:  05/04/2023: 5 times sit to stand: 9.25 sec without UE support Timed up and go (TUG): 9.1 sec  06/04/2023: 5 times sit to stand:  6.63 sec Timed up and go (TUG): 6.73 sec  GAIT: Distance walked: >100  ft Assistive device utilized: None Level of assistance: Complete Independence Comments: Pt reports that she is able to ambulate as long as she needs to without having increased pain   TODAY'S TREATMENT:    DATE:  06/04/2023 NuStep: Level 3x 8 minutes- PT present to discuss progress Seated hamstring stretch 3x20 seconds  Sit to stand 2x10 added 5# kettle bell  Seated piriformis stretch 2x20 sec bilat Supine pelvic tilt and TA activation 2x10 Lower trunk rotation with LE on red pball x10 Supine with LE on red bpall bringing legs into body and performing bridge x5 Supine bridge x5 Supine isometric dead bug 5x10 sec Supine diagonal press into 90/90 contralateral LE x10   06/01/23 Pt seen for aquatic therapy today.  Treatment took place in water 3.5-4.75 ft in depth at the [facility] pool. Temp of water was 91.  Pt entered/exited the pool via stairs independently with bilat rail. * unsupported:  walking forward/ backward with reciprocal arm swing * side stepping with rainbow hand floats, arm addct / abdct * farmers carry with bilat rainbow hand floats under water x 2 laps  * L stretch at wall with slight bend in knees, feet on wall * TrA set with short blue noodle pull down x 5; repeated with solid noodle x 10 ( standard stance)  * UE on wall:  straight leg hip circles "draw circle with toes on ground" CW, CCW x 10 each  * return to marching with reciprocal arm swing * plank position with hands on bench, then with alternating hip ext x 5, slow mountain climbers, fire hydrants x 10 each LE  * straddling noodle without UE support:  cycling, reverse jumping jacks  * L stretch repeated  *once dried off: applied 3 -I strips in star pattern over Rt lateral hip with 20% stretch     05/28/2023  NuStep: Level 3x 6 minutes- PT present to discuss progress Seated hamstring stretch 3x20 seconds  Sit to stand 2x10 added 5# kettle bell  Supine pelvic tilt and TA activation Ball squeezes  and hip abuduction with band Supine figure 4 and diagonal knee to chest  Sidelying clams x10 each Supine isometrics: opp arm/leg   PATIENT EDUCATION:  Education details: kinesiology tape rationale, and safe care removal  Person educated: Patient Education method: Programmer, multimedia, Demonstration Education comprehension: verbalized understanding and returned demonstration  HOME EXERCISE PROGRAM: Access Code: [email or web address] URL: [email or web address] Date: 06/04/2023 Prepared by: Clydie Braun Seairra Otani  Exercises - Seated Piriformis Stretch with Trunk Bend  - 1 x daily - 7 x weekly - 2 reps - 20 sec hold - Seated Hamstring Stretch  - 1 x daily - 7 x weekly - 2 reps - 20 sec hold - Seated Transversus Abdominis Bracing  - 1 x daily - 7 x weekly - 2 sets - 10 reps - 2 sec hold - Squat with Chair Touch  - 1 x daily - 7 x weekly - 2 sets - 10 reps - Supine Lower Trunk Rotation  -  1 x daily - 7 x weekly - 1 sets - 10 reps - Supine Posterior Pelvic Tilt  - 1 x daily - 7 x weekly - 2 sets - 10 reps - Supine Transversus Abdominis Bracing - Hands on Thighs  - 1 x daily - 7 x weekly - 2 sets - 10 reps - Supine Hip Adduction Isometric with Ball  - 2 x daily - 7 x weekly - 2 sets - 10 reps - Hooklying Clamshell with Resistance  - 2 x daily - 7 x weekly - 2 sets - 10 reps - Crisscross 90/90  - 1 x daily - 7 x weekly - 3 sets - 10 reps   ASSESSMENT:  CLINICAL IMPRESSION:   Donyelle presents to skilled PT reporting that she feels that she has made a turn and is progressing towards goals.  Pt with good progress towards goals and has met FOTO goal at this time.  Patient is on track for discharge at end of this certification period.  Patient requires min cuing for core activation, but with good TA activation noted.  OBJECTIVE IMPAIRMENTS: decreased activity tolerance, difficulty walking, decreased strength, increased muscle spasms, impaired flexibility, postural dysfunction, and pain.   ACTIVITY LIMITATIONS:  lifting, bending, and sitting  PARTICIPATION LIMITATIONS: community activity  PERSONAL FACTORS: Time since onset of injury/illness/exacerbation and 1-2 comorbidities: OA, prolapsed bladder  are also affecting patient's functional outcome.   REHAB POTENTIAL: Good  CLINICAL DECISION MAKING: Stable/uncomplicated  EVALUATION COMPLEXITY: Low   GOALS: Goals reviewed with patient? Yes  SHORT TERM GOALS: Target date: 05/29/2023 Pt will be independent with initial HEP. Baseline: Goal status: MET (05/28/23)  2.  Pt will report at least a 30% improvement in symptoms since starting initial evaluation. Baseline:  0% (05/28/23) Goal status: MET on 06/04/2023   LONG TERM GOALS: Target date: 06/26/2023  Pt will be independent with advanced HEP. Baseline:  Goal status: Ongoing  2.  Patient will increase FOTO to 63 to demonstrate improvements in functional mobility. Baseline: 52 Goal status: Met on 06/04/2023  3.  Pt to be able to ride in the car/sit for at least 1 hour without increased pain. Baseline: havent tried this (05/28/23) Goal status: Ongoing (on 8/15 can ride for at least 20 min without pain)  4.  Pt to be able to sit on the floor and stand up without increased pain to allow her to play games with granddaughters. Baseline:  Goal status: Ongoing  5.  Pt will be able to increase bilat LE strength to at least 4+/5 to allow patient to navigate stairs with reciprocal pattern. Baseline:  Goal status: Ongoing  PLAN:  PT FREQUENCY: 2x/week  PT DURATION: 8 weeks  PLANNED INTERVENTIONS: Therapeutic exercises, Therapeutic activity, Neuromuscular re-education, Balance training, Gait training, Patient/Family education, Self Care, Joint mobilization, Joint manipulation, Stair training, Aquatic Therapy, Dry Needling, Spinal manipulation, Spinal mobilization, Cryotherapy, Moist heat, Taping, Ultrasound, Ionotophoresis 4mg /ml Dexamethasone, Manual therapy, and Re-evaluation  PLAN FOR NEXT  SESSION: Continue aquatics, postural strength and flexibility, core stabilization   Reather Laurence, PT, DPT 06/04/23, 11:05 AM  [facility] [address] [place], [place] [address] Phone # [phone] Fax [phone]

## 2023-06-08 ENCOUNTER — Encounter (HOSPITAL_BASED_OUTPATIENT_CLINIC_OR_DEPARTMENT_OTHER): Payer: Self-pay | Admitting: Physical Therapy

## 2023-06-08 ENCOUNTER — Ambulatory Visit (HOSPITAL_BASED_OUTPATIENT_CLINIC_OR_DEPARTMENT_OTHER): Payer: BC Managed Care – PPO | Admitting: Physical Therapy

## 2023-06-08 DIAGNOSIS — M545 Low back pain, unspecified: Secondary | ICD-10-CM | POA: Diagnosis not present

## 2023-06-08 DIAGNOSIS — M6281 Muscle weakness (generalized): Secondary | ICD-10-CM

## 2023-06-08 DIAGNOSIS — M160 Bilateral primary osteoarthritis of hip: Secondary | ICD-10-CM | POA: Diagnosis not present

## 2023-06-08 DIAGNOSIS — R2689 Other abnormalities of gait and mobility: Secondary | ICD-10-CM

## 2023-06-08 DIAGNOSIS — M5459 Other low back pain: Secondary | ICD-10-CM

## 2023-06-08 DIAGNOSIS — G8929 Other chronic pain: Secondary | ICD-10-CM | POA: Diagnosis not present

## 2023-06-08 NOTE — Therapy (Signed)
OUTPATIENT PHYSICAL THERAPY TREATMENT   Patient Name: Kayla Fisher MRN: [id number] DOB:07-05-1961, 62 y.o., female Today's Date: 06/08/2023  END OF SESSION:  PT End of Session - 06/08/23 0903     Visit Number 10    Date for PT Re-Evaluation 06/26/23    Authorization Type BC/BS    PT Start Time 0902    PT Stop Time 0940    PT Time Calculation (min) 38 min    Activity Tolerance Patient tolerated treatment well    Behavior During Therapy [facility] for tasks assessed/performed                  Past Medical History:  Diagnosis Date   Gestational diabetes mellitus (GDM)    Hypertension    Morbid obesity (HCC)    Past Surgical History:  Procedure Laterality Date   ANTERIOR (CYSTOCELE) AND POSTERIOR REPAIR (RECTOCELE) WITH XENFORM GRAFT AND SACROSPINOUS FIXATION     CHOLECYSTECTOMY     TOTAL ABDOMINAL HYSTERECTOMY     Patient Active Problem List   Diagnosis Date Noted   Hyperlipidemia 02/24/2020   Vitamin D deficiency 10/05/2019   Hypertension    Morbid obesity (HCC)    Gestational diabetes mellitus (GDM)     PCP: Philip Aspen, Limmie Patricia, MD  REFERRING PROVIDER: Philip Aspen, Limmie Patricia, MD  REFERRING DIAG: M16.0 (ICD-10-CM) - Bilateral primary osteoarthritis of hip M54.50,G89.29 (ICD-10-CM) - Chronic bilateral low back pain without sciatica  THERAPY DIAG:  Other low back pain  Other abnormalities of gait and mobility  Muscle weakness (generalized)  Rationale for Evaluation and Treatment: Rehabilitation  ONSET DATE: Has had since 2017, but in past 3 months, the pain has started getting worse  SUBJECTIVE:   SUBJECTIVE STATEMENT:  "I feel like I've met my goals.  I have questions regarding exercises (land) that I'll ask to [place] this week."  Pt voiced readiness to d/c from aquatic therapy.  She reports no difference in hip with application of k-tape last visit.   PERTINENT HISTORY: OA, HTN, Hx of hysterectomy and prolapsed bladder PAIN:  Are  you having pain? Yes: NPRS scale: 1/10 Pain location: back and bilateral hips (right hip worse than left) Pain description: aching, sharp Aggravating factors: sitting.  "I can hardly move in the morning when I get out of bed." Relieving factors: Iburpofen and movement  PRECAUTIONS: None  RED FLAGS: Bowel or bladder incontinence: Yes: has a prolapsed bladder    WEIGHT BEARING RESTRICTIONS: No  FALLS:  Has patient fallen in last 6 months? No  LIVING ENVIRONMENT: Lives with: lives with their spouse Lives in: House/apartment Stairs: Yes: Internal: 4 steps; on right going up and External: 3 steps; on right going up Has following equipment at home: None  OCCUPATION: Retired  PLOF: Independent and Leisure: spend time with granddaughters  PATIENT GOALS: To help control the pain and build the muscles up.  NEXT MD VISIT: Pending visit with Dr Berton Lan, Dr Ardyth Harps for yearly check up  OBJECTIVE:   DIAGNOSTIC FINDINGS: Pt believes that she had a MRI in 2017 which revealed hip OA  PATIENT SURVEYS:  Eval:  FOTO 52 (projected 63 by visit 12) 06/04/2023:  FOTO 75  COGNITION: Overall cognitive status: Within functional limits for tasks assessed     SENSATION: Reports every once in a while when she gets up in the middle of the night, she has some foot tingling "maybe 3 times a month"  MUSCLE LENGTH: Piriformis tightness noted bilat  POSTURE: rounded shoulders, forward  head, and anterior pelvic tilt  PALPATION: Mild tenderness to palpation along lumbar paraspinals and bilat glutes/piriformis  LOWER EXTREMITY ROM: WFL  LOWER EXTREMITY MMT: Eval: Right hip strength of 4-/5 grossly throughout.  Right quad strength 4/5.  Right hamstring strength of 4-/5 Left hip strength of grossly 4/5.  Left quad strength is 5-/5.  Left hamstring strength is 4/5.  FUNCTIONAL TESTS:  05/04/2023: 5 times sit to stand: 9.25 sec without UE support Timed up and go (TUG): 9.1 sec  06/04/2023: 5  times sit to stand:  6.63 sec Timed up and go (TUG): 6.73 sec  GAIT: Distance walked: >100 ft Assistive device utilized: None Level of assistance: Complete Independence Comments: Pt reports that she is able to ambulate as long as she needs to without having increased pain   TODAY'S TREATMENT:    DATE:  06/08/23 Pt seen for aquatic therapy today.  Treatment took place in water 3.5-4.75 ft in depth at the [facility] pool. Temp of water was 91.  Pt entered/exited the pool via stairs independently with bilat rail. * unsupported:  walking forward/ backward with reciprocal arm swing * side stepping with rainbow hand floats, arm addct / abdct * farmers carry with bilat rainbow hand floats under water x 2 laps marching  * L stretch at wall with slight bend in knees, feet on wall * TrA set with solid noodle x 10 ( standard stance, then staggered stance)  * UE on solid noodle:  straight leg hip circles "draw circle with toes on ground" CW, CCW x 10 each;  knee to noodle taps (ipsilateral/contralateral) in place then moving forward * plank position with hands on bench, then with alternating hip ext x 5, slow mountain climbers, fire hydrants x 10 each LE  * straddling noodle without UE support:  cycling, reverse jumping jacks    06/04/2023 NuStep: Level 3x 8 minutes- PT present to discuss progress Seated hamstring stretch 3x20 seconds  Sit to stand 2x10 added 5# kettle bell  Seated piriformis stretch 2x20 sec bilat Supine pelvic tilt and TA activation 2x10 Lower trunk rotation with LE on red pball x10 Supine with LE on red bpall bringing legs into body and performing bridge x5 Supine bridge x5 Supine isometric dead bug 5x10 sec Supine diagonal press into 90/90 contralateral LE x10   06/01/23 Pt seen for aquatic therapy today.  Treatment took place in water 3.5-4.75 ft in depth at the [facility] pool. Temp of water was 91.  Pt entered/exited the pool via stairs  independently with bilat rail. * unsupported:  walking forward/ backward with reciprocal arm swing * side stepping with rainbow hand floats, arm addct / abdct * farmers carry with bilat rainbow hand floats under water x 2 laps  * L stretch at wall with slight bend in knees, feet on wall * TrA set with short blue noodle pull down x 5; repeated with solid noodle x 10 ( standard stance)  * UE on wall:  straight leg hip circles "draw circle with toes on ground" CW, CCW x 10 each  * return to marching with reciprocal arm swing * plank position with hands on bench, then with alternating hip ext x 5, slow mountain climbers, fire hydrants x 10 each LE  * straddling noodle without UE support:  cycling, reverse jumping jacks  * L stretch repeated  *once dried off: applied 3 -I strips in star pattern over Rt lateral hip with 20% stretch     05/28/2023  NuStep: Level 3x 6 minutes- PT present to discuss progress Seated hamstring stretch 3x20 seconds  Sit to stand 2x10 added 5# kettle bell  Supine pelvic tilt and TA activation Ball squeezes and hip abuduction with band Supine figure 4 and diagonal knee to chest  Sidelying clams x10 each Supine isometrics: opp arm/leg   PATIENT EDUCATION:  Education details: kinesiology tape rationale, and safe care removal  Person educated: Patient Education method: Programmer, multimedia, Demonstration Education comprehension: verbalized understanding and returned demonstration  HOME EXERCISE PROGRAM: Access Code: [email or web address] URL: [email or web address] Date: 06/04/2023 Prepared by: Clydie Braun Menke  Exercises - Seated Piriformis Stretch with Trunk Bend  - 1 x daily - 7 x weekly - 2 reps - 20 sec hold - Seated Hamstring Stretch  - 1 x daily - 7 x weekly - 2 reps - 20 sec hold - Seated Transversus Abdominis Bracing  - 1 x daily - 7 x weekly - 2 sets - 10 reps - 2 sec hold - Squat with Chair Touch  - 1 x daily - 7 x weekly - 2 sets - 10 reps - Supine Lower  Trunk Rotation  - 1 x daily - 7 x weekly - 1 sets - 10 reps - Supine Posterior Pelvic Tilt  - 1 x daily - 7 x weekly - 2 sets - 10 reps - Supine Transversus Abdominis Bracing - Hands on Thighs  - 1 x daily - 7 x weekly - 2 sets - 10 reps - Supine Hip Adduction Isometric with Ball  - 2 x daily - 7 x weekly - 2 sets - 10 reps - Hooklying Clamshell with Resistance  - 2 x daily - 7 x weekly - 2 sets - 10 reps - Crisscross 90/90  - 1 x daily - 7 x weekly - 3 sets - 10 reps   ASSESSMENT:  CLINICAL IMPRESSION:   Positive response to aquatic therapy.  She tolerated all exercises well, without any pain.  She requests to d/c from aquatic therapy today.  She is unsure if she will join a pool. If she decides to join pool,  she can reach out to aquatic therapist for aquatic HEP.  Per PT at last visit:   Patient is on track for discharge at end of this certification period.   OBJECTIVE IMPAIRMENTS: decreased activity tolerance, difficulty walking, decreased strength, increased muscle spasms, impaired flexibility, postural dysfunction, and pain.   ACTIVITY LIMITATIONS: lifting, bending, and sitting  PARTICIPATION LIMITATIONS: community activity  PERSONAL FACTORS: Time since onset of injury/illness/exacerbation and 1-2 comorbidities: OA, prolapsed bladder  are also affecting patient's functional outcome.   REHAB POTENTIAL: Good  CLINICAL DECISION MAKING: Stable/uncomplicated  EVALUATION COMPLEXITY: Low   GOALS: Goals reviewed with patient? Yes  SHORT TERM GOALS: Target date: 05/29/2023 Pt will be independent with initial HEP. Baseline: Goal status: MET (05/28/23)  2.  Pt will report at least a 30% improvement in symptoms since starting initial evaluation. Baseline:  0% (05/28/23) Goal status: MET on 06/04/2023   LONG TERM GOALS: Target date: 06/26/2023  Pt will be independent with advanced HEP. Baseline:  Goal status: Ongoing  2.  Patient will increase FOTO to 63 to demonstrate improvements in  functional mobility. Baseline: 52 Goal status: Met on 06/04/2023  3.  Pt to be able to ride in the car/sit for at least 1 hour without increased pain. Baseline: havent tried this (05/28/23) Goal status: Ongoing (on 8/15 can ride for at least 20 min without pain)  4.  Pt to  be able to sit on the floor and stand up without increased pain to allow her to play games with granddaughters. Baseline:  Goal status: Ongoing  5.  Pt will be able to increase bilat LE strength to at least 4+/5 to allow patient to navigate stairs with reciprocal pattern. Baseline:  Goal status: Ongoing  PLAN:  PT FREQUENCY: 2x/week  PT DURATION: 8 weeks  PLANNED INTERVENTIONS: Therapeutic exercises, Therapeutic activity, Neuromuscular re-education, Balance training, Gait training, Patient/Family education, Self Care, Joint mobilization, Joint manipulation, Stair training, Aquatic Therapy, Dry Needling, Spinal manipulation, Spinal mobilization, Cryotherapy, Moist heat, Taping, Ultrasound, Ionotophoresis 4mg /ml Dexamethasone, Manual therapy, and Re-evaluation  PLAN FOR NEXT SESSION: postural strength and flexibility, core stabilization Mayer Camel, PTA 06/08/23 9:44 AM [facility] [address] [place], [place], [address] Phone: [phone]   Fax:  [phone]

## 2023-06-11 ENCOUNTER — Ambulatory Visit: Payer: BC Managed Care – PPO

## 2023-06-11 DIAGNOSIS — R2689 Other abnormalities of gait and mobility: Secondary | ICD-10-CM

## 2023-06-11 DIAGNOSIS — M5459 Other low back pain: Secondary | ICD-10-CM

## 2023-06-11 DIAGNOSIS — M6281 Muscle weakness (generalized): Secondary | ICD-10-CM | POA: Diagnosis not present

## 2023-06-11 DIAGNOSIS — R252 Cramp and spasm: Secondary | ICD-10-CM | POA: Diagnosis not present

## 2023-06-11 NOTE — Therapy (Signed)
OUTPATIENT PHYSICAL THERAPY TREATMENT   Patient Name: Kayla Fisher MRN: [id number] DOB:05/22/1961, 62 y.o., female Today's Date: 06/11/2023  END OF SESSION:  PT End of Session - 06/11/23 0906     Visit Number 11    PT Start Time 0847    PT Stop Time 0923    PT Time Calculation (min) 36 min    Activity Tolerance Patient tolerated treatment well    Behavior During Therapy [facility] for tasks assessed/performed                   Past Medical History:  Diagnosis Date   Gestational diabetes mellitus (GDM)    Hypertension    Morbid obesity (HCC)    Past Surgical History:  Procedure Laterality Date   ANTERIOR (CYSTOCELE) AND POSTERIOR REPAIR (RECTOCELE) WITH XENFORM GRAFT AND SACROSPINOUS FIXATION     CHOLECYSTECTOMY     TOTAL ABDOMINAL HYSTERECTOMY     Patient Active Problem List   Diagnosis Date Noted   Hyperlipidemia 02/24/2020   Vitamin D deficiency 10/05/2019   Hypertension    Morbid obesity (HCC)    Gestational diabetes mellitus (GDM)     PCP: Philip Aspen, Limmie Patricia, MD  REFERRING PROVIDER: Philip Aspen, Limmie Patricia, MD  REFERRING DIAG: M16.0 (ICD-10-CM) - Bilateral primary osteoarthritis of hip M54.50,G89.29 (ICD-10-CM) - Chronic bilateral low back pain without sciatica  THERAPY DIAG:  Other low back pain  Other abnormalities of gait and mobility  Muscle weakness (generalized)  Cramp and spasm  Rationale for Evaluation and Treatment: Rehabilitation  ONSET DATE: Has had since 2017, but in past 3 months, the pain has started getting worse  SUBJECTIVE:   SUBJECTIVE STATEMENT:  I feel 70% better.  I'm ready for this to be my last.   PERTINENT HISTORY: OA, HTN, Hx of hysterectomy and prolapsed bladder PAIN:  Are you having pain? Yes: NPRS scale: 1/10 Pain location: back and bilateral hips (right hip worse than left) Pain description: aching, sharp Aggravating factors: sitting.  "I can hardly move in the morning when I get out of  bed." Relieving factors: Iburpofen and movement  PRECAUTIONS: None  RED FLAGS: Bowel or bladder incontinence: Yes: has a prolapsed bladder    WEIGHT BEARING RESTRICTIONS: No  FALLS:  Has patient fallen in last 6 months? No  LIVING ENVIRONMENT: Lives with: lives with their spouse Lives in: House/apartment Stairs: Yes: Internal: 4 steps; on right going up and External: 3 steps; on right going up Has following equipment at home: None  OCCUPATION: Retired  PLOF: Independent and Leisure: spend time with granddaughters  PATIENT GOALS: To help control the pain and build the muscles up.  NEXT MD VISIT: Pending visit with Dr Berton Lan, Dr Ardyth Harps for yearly check up  OBJECTIVE:   DIAGNOSTIC FINDINGS: Pt believes that she had a MRI in 2017 which revealed hip OA  PATIENT SURVEYS:  Eval:  FOTO 52 (projected 63 by visit 12) 06/04/2023:  FOTO 75  COGNITION: Overall cognitive status: Within functional limits for tasks assessed     SENSATION: Reports every once in a while when she gets up in the middle of the night, she has some foot tingling "maybe 3 times a month"  MUSCLE LENGTH: Piriformis tightness noted bilat  POSTURE: rounded shoulders, forward head, and anterior pelvic tilt  PALPATION: Mild tenderness to palpation along lumbar paraspinals and bilat glutes/piriformis  LOWER EXTREMITY ROM: WFL  LOWER EXTREMITY MMT: Eval: Right hip strength of 4-/5 grossly throughout.  Right quad strength 4/5.  Right hamstring strength of 4-/5 Left hip strength of grossly 4/5.  Left quad strength is 5-/5.  Left hamstring strength is 4/5.  FUNCTIONAL TESTS:  05/04/2023: 5 times sit to stand: 9.25 sec without UE support Timed up and go (TUG): 9.1 sec  06/04/2023: 5 times sit to stand:  6.63 sec Timed up and go (TUG): 6.73 sec  GAIT: Distance walked: >100 ft Assistive device utilized: None Level of assistance: Complete Independence Comments: Pt reports that she is able to ambulate  as long as she needs to without having increased pain   TODAY'S TREATMENT:    06/11/2023 NuStep: Level 3x 8 minutes- PT present to discuss progress Seated hamstring stretch 3x20 seconds  Sit to stand 2x10 added 5# kettle bell  Seated piriformis stretch 2x20 sec bilat Supine pelvic tilt and TA activation 2x10 Lower trunk rotation x10 Supine ball squeeze and hip abduction with blue loop 2x10 bil each Supine diagonal press into 90/90 contralateral LE x10  DATE:  06/08/23 Pt seen for aquatic therapy today.  Treatment took place in water 3.5-4.75 ft in depth at the [facility] pool. Temp of water was 91.  Pt entered/exited the pool via stairs independently with bilat rail. * unsupported:  walking forward/ backward with reciprocal arm swing * side stepping with rainbow hand floats, arm addct / abdct * farmers carry with bilat rainbow hand floats under water x 2 laps marching  * L stretch at wall with slight bend in knees, feet on wall * TrA set with solid noodle x 10 ( standard stance, then staggered stance)  * UE on solid noodle:  straight leg hip circles "draw circle with toes on ground" CW, CCW x 10 each;  knee to noodle taps (ipsilateral/contralateral) in place then moving forward * plank position with hands on bench, then with alternating hip ext x 5, slow mountain climbers, fire hydrants x 10 each LE  * straddling noodle without UE support:  cycling, reverse jumping jacks    06/04/2023 NuStep: Level 3x 8 minutes- PT present to discuss progress Seated hamstring stretch 3x20 seconds  Sit to stand 2x10 added 5# kettle bell  Seated piriformis stretch 2x20 sec bilat Supine pelvic tilt and TA activation 2x10 Lower trunk rotation with LE on red pball x10 Supine with LE on red bpall bringing legs into body and performing bridge x5 Supine bridge x5 Supine isometric dead bug 5x10 sec Supine diagonal press into 90/90 contralateral LE x10   06/01/23 Pt seen for aquatic therapy  today.  Treatment took place in water 3.5-4.75 ft in depth at the [facility] pool. Temp of water was 91.  Pt entered/exited the pool via stairs independently with bilat rail. * unsupported:  walking forward/ backward with reciprocal arm swing * side stepping with rainbow hand floats, arm addct / abdct * farmers carry with bilat rainbow hand floats under water x 2 laps  * L stretch at wall with slight bend in knees, feet on wall * TrA set with short blue noodle pull down x 5; repeated with solid noodle x 10 ( standard stance)  * UE on wall:  straight leg hip circles "draw circle with toes on ground" CW, CCW x 10 each  * return to marching with reciprocal arm swing * plank position with hands on bench, then with alternating hip ext x 5, slow mountain climbers, fire hydrants x 10 each LE  * straddling noodle without UE support:  cycling, reverse jumping jacks  * L stretch repeated  *  once dried off: applied 3 -I strips in star pattern over Rt lateral hip with 20% stretch    PATIENT EDUCATION:  Education details: kinesiology tape rationale, and safe care removal  Person educated: Patient Education method: Explanation, Demonstration Education comprehension: verbalized understanding and returned demonstration  HOME EXERCISE PROGRAM: Access Code: [email or web address] URL: [email or web address] Date: 06/04/2023 Prepared by: Clydie Braun Menke  Exercises - Seated Piriformis Stretch with Trunk Bend  - 1 x daily - 7 x weekly - 2 reps - 20 sec hold - Seated Hamstring Stretch  - 1 x daily - 7 x weekly - 2 reps - 20 sec hold - Seated Transversus Abdominis Bracing  - 1 x daily - 7 x weekly - 2 sets - 10 reps - 2 sec hold - Squat with Chair Touch  - 1 x daily - 7 x weekly - 2 sets - 10 reps - Supine Lower Trunk Rotation  - 1 x daily - 7 x weekly - 1 sets - 10 reps - Supine Posterior Pelvic Tilt  - 1 x daily - 7 x weekly - 2 sets - 10 reps - Supine Transversus Abdominis Bracing - Hands on  Thighs  - 1 x daily - 7 x weekly - 2 sets - 10 reps - Supine Hip Adduction Isometric with Ball  - 2 x daily - 7 x weekly - 2 sets - 10 reps - Hooklying Clamshell with Resistance  - 2 x daily - 7 x weekly - 2 sets - 10 reps - Crisscross 90/90  - 1 x daily - 7 x weekly - 3 sets - 10 reps   ASSESSMENT:  CLINICAL IMPRESSION:   Pt requested D/C to HEP due to 70% overall improvement.  Pt has met all goals.  She is making body mechanics modifications and using TA activation during activity.  PT reviewed all HEP with pt and discussed how to build a program moving forward.   OBJECTIVE IMPAIRMENTS: decreased activity tolerance, difficulty walking, decreased strength, increased muscle spasms, impaired flexibility, postural dysfunction, and pain.   ACTIVITY LIMITATIONS: lifting, bending, and sitting  PARTICIPATION LIMITATIONS: community activity  PERSONAL FACTORS: Time since onset of injury/illness/exacerbation and 1-2 comorbidities: OA, prolapsed bladder  are also affecting patient's functional outcome.   REHAB POTENTIAL: Good  CLINICAL DECISION MAKING: Stable/uncomplicated  EVALUATION COMPLEXITY: Low   GOALS: Goals reviewed with patient? Yes  SHORT TERM GOALS: Target date: 05/29/2023 Pt will be independent with initial HEP. Baseline: Goal status: MET (05/28/23)  2.  Pt will report at least a 30% improvement in symptoms since starting initial evaluation. Baseline:  0% (05/28/23) Goal status: MET on 06/04/2023   LONG TERM GOALS: Target date: 06/26/2023  Pt will be independent with advanced HEP. Baseline:  Goal status: MET  2.  Patient will increase FOTO to 63 to demonstrate improvements in functional mobility. Baseline:  Goal status: Met on 06/04/2023  3.  Pt to be able to ride in the car/sit for at least 1 hour without increased pain. Baseline: no problem with this (06/11/23) Goal status: MET  4.  Pt to be able to sit on the floor and stand up without increased pain to allow her to  play games with granddaughters. Baseline: able to do this (06/11/23) Goal status: MET  5.  Pt will be able to increase bilat LE strength to at least 4+/5 to allow patient to navigate stairs with reciprocal pattern. Baseline:  Goal status: Ongoing  PLAN:  PHYSICAL THERAPY DISCHARGE SUMMARY  Visits from Start of  Care: 11  Current functional level related to goals / functional outcomes: Pt has met all goals and will D/C to HEP today   Remaining deficits: Intermittent hip and low back pain.  Pt has a home TENs unit and HEP to address this.    Education / Equipment: HEP, Estate manager/land agent    Patient agrees to discharge. Patient goals were met. Patient is being discharged due to meeting the stated rehab goals.   Lorrene Reid, PT 06/11/23 9:26 AM

## 2023-06-15 ENCOUNTER — Ambulatory Visit (HOSPITAL_BASED_OUTPATIENT_CLINIC_OR_DEPARTMENT_OTHER): Payer: BC Managed Care – PPO | Admitting: Physical Therapy

## 2023-06-23 ENCOUNTER — Ambulatory Visit (HOSPITAL_BASED_OUTPATIENT_CLINIC_OR_DEPARTMENT_OTHER): Payer: BC Managed Care – PPO | Admitting: Physical Therapy

## 2023-06-26 ENCOUNTER — Encounter: Payer: Self-pay | Admitting: Internal Medicine

## 2023-06-30 ENCOUNTER — Ambulatory Visit
Admission: RE | Admit: 2023-06-30 | Discharge: 2023-06-30 | Disposition: A | Discharge status: Home / Self Care | Payer: BC Managed Care – PPO | Source: Ambulatory Visit | Attending: Internal Medicine | Admitting: Internal Medicine

## 2023-06-30 DIAGNOSIS — Z1231 Encounter for screening mammogram for malignant neoplasm of breast: Secondary | ICD-10-CM | POA: Diagnosis not present

## 2023-09-30 ENCOUNTER — Encounter: Payer: Self-pay | Admitting: Internal Medicine

## 2023-09-30 ENCOUNTER — Ambulatory Visit: Payer: BC Managed Care – PPO | Admitting: Internal Medicine

## 2023-09-30 VITALS — BP 110/80 | HR 70 | Temp 97.6°F | Ht 60.5 in | Wt 168.0 lb

## 2023-09-30 DIAGNOSIS — R1319 Other dysphagia: Secondary | ICD-10-CM

## 2023-09-30 DIAGNOSIS — Z1211 Encounter for screening for malignant neoplasm of colon: Secondary | ICD-10-CM

## 2023-09-30 DIAGNOSIS — I10 Essential (primary) hypertension: Secondary | ICD-10-CM | POA: Diagnosis not present

## 2023-09-30 DIAGNOSIS — Z0000 Encounter for general adult medical examination without abnormal findings: Secondary | ICD-10-CM

## 2023-09-30 DIAGNOSIS — Z114 Encounter for screening for human immunodeficiency virus [HIV]: Secondary | ICD-10-CM | POA: Diagnosis not present

## 2023-09-30 DIAGNOSIS — Z23 Encounter for immunization: Secondary | ICD-10-CM

## 2023-09-30 DIAGNOSIS — Z1159 Encounter for screening for other viral diseases: Secondary | ICD-10-CM | POA: Diagnosis not present

## 2023-09-30 DIAGNOSIS — E559 Vitamin D deficiency, unspecified: Secondary | ICD-10-CM | POA: Diagnosis not present

## 2023-09-30 DIAGNOSIS — E782 Mixed hyperlipidemia: Secondary | ICD-10-CM

## 2023-09-30 LAB — CBC WITH DIFFERENTIAL/PLATELET
Basophils Absolute: 0 10*3/uL (ref 0.0–0.1)
Basophils Relative: 0.6 % (ref 0.0–3.0)
Eosinophils Absolute: 0.2 10*3/uL (ref 0.0–0.7)
Eosinophils Relative: 3.2 % (ref 0.0–5.0)
HCT: 39.4 % (ref 36.0–46.0)
Hemoglobin: 13 g/dL (ref 12.0–15.0)
Lymphocytes Relative: 29.9 % (ref 12.0–46.0)
Lymphs Abs: 1.4 10*3/uL (ref 0.7–4.0)
MCHC: 33 g/dL (ref 30.0–36.0)
MCV: 94.7 fL (ref 78.0–100.0)
Monocytes Absolute: 0.4 10*3/uL (ref 0.1–1.0)
Monocytes Relative: 7.6 % (ref 3.0–12.0)
Neutro Abs: 2.8 10*3/uL (ref 1.4–7.7)
Neutrophils Relative %: 58.7 % (ref 43.0–77.0)
Platelets: 214 10*3/uL (ref 150.0–400.0)
RBC: 4.16 Mil/uL (ref 3.87–5.11)
RDW: 13.1 % (ref 11.5–15.5)
WBC: 4.8 10*3/uL (ref 4.0–10.5)

## 2023-09-30 LAB — TSH: TSH: 1.58 u[IU]/mL (ref 0.35–5.50)

## 2023-09-30 LAB — COMPREHENSIVE METABOLIC PANEL
ALT: 15 U/L (ref 0–35)
AST: 20 U/L (ref 0–37)
Albumin: 3.8 g/dL (ref 3.5–5.2)
Alkaline Phosphatase: 75 U/L (ref 39–117)
BUN: 13 mg/dL (ref 6–23)
CO2: 29 meq/L (ref 19–32)
Calcium: 9.3 mg/dL (ref 8.4–10.5)
Chloride: 105 meq/L (ref 96–112)
Creatinine, Ser: 0.68 mg/dL (ref 0.40–1.20)
GFR: 94.12 mL/min (ref >60.00)
Glucose, Bld: 91 mg/dL (ref 70–99)
Potassium: 4.3 meq/L (ref 3.5–5.1)
Sodium: 140 meq/L (ref 135–145)
Total Bilirubin: 0.7 mg/dL (ref 0.2–1.2)
Total Protein: 6.8 g/dL (ref 6.0–8.3)

## 2023-09-30 LAB — LIPID PANEL
Cholesterol: 135 mg/dL (ref 0–200)
HDL: 53.8 mg/dL (ref >39.00)
LDL Cholesterol: 68 mg/dL (ref 0–99)
NonHDL: 81.44
Total CHOL/HDL Ratio: 3
Triglycerides: 66 mg/dL (ref 0.0–149.0)
VLDL: 13.2 mg/dL (ref 0.0–40.0)

## 2023-09-30 LAB — VITAMIN D 25 HYDROXY (VIT D DEFICIENCY, FRACTURES): VITD: 23.16 ng/mL — ABNORMAL LOW (ref 30.00–100.00)

## 2023-09-30 LAB — VITAMIN B12: Vitamin B-12: 428 pg/mL (ref 211–911)

## 2023-09-30 NOTE — Addendum Note (Signed)
Addended by: Kern Reap B on: 09/30/2023 09:04 AM   Modules accepted: Orders

## 2023-09-30 NOTE — Progress Notes (Signed)
Established Patient Office Visit     CC/Reason for Visit: Annual preventive exam and discuss acute concerns  HPI: Kayla Fisher is a 62 y.o. female who is coming in today for the above mentioned reasons. Past Medical History is significant for: Hypertension, hyperlipidemia, vitamin D deficiency, obesity.  Has routine eye and dental care.  Is due for her seasonal vaccines including flu, COVID, RSV.  She agrees to [employer] but declines colonoscopy for colon cancer screening, mammogram is up-to-date.  Had a total hysterectomy so no longer does Pap smears.  She has been complaining of dysphagia mainly with solid foods.  Feels like food will get stuck in her upper chest and then feels fluttering of her heart.   Past Medical/Surgical History: Past Medical History:  Diagnosis Date   Gestational diabetes mellitus (GDM)    Hypertension    Morbid obesity (HCC)     Past Surgical History:  Procedure Laterality Date   ANTERIOR (CYSTOCELE) AND POSTERIOR REPAIR (RECTOCELE) WITH XENFORM GRAFT AND SACROSPINOUS FIXATION     CHOLECYSTECTOMY     TOTAL ABDOMINAL HYSTERECTOMY      Social History:  reports that she has never smoked. She has never used smokeless tobacco. She reports current alcohol use. She reports that she does not use drugs.  Allergies: No Known Allergies  Family History:  Family History  Problem Relation Age of Onset   Ovarian cancer Mother    Diabetes Father    Luiz Blare' disease Father    Atrial fibrillation Father    Peripheral Artery Disease Father    Ovarian cancer Sister    Sick sinus syndrome Sister    Breast cancer Cousin      Current Outpatient Medications:    albuterol (VENTOLIN HFA) 108 (90 Base) MCG/ACT inhaler, Inhale 2 puffs into the lungs every 6 (six) hours as needed for wheezing or shortness of breath., Disp: 8 g, Rfl: 2   atorvastatin (LIPITOR) 20 MG tablet, TAKE 1 TABLET BY MOUTH EVERY DAY, Disp: 90 tablet, Rfl: 1   Multiple Vitamin  (MULTIVITAMIN) tablet, Take 1 tablet by mouth daily., Disp: , Rfl:   Review of Systems:  Negative unless indicated in HPI.   Physical Exam: Vitals:   09/30/23 0711  BP: 110/80  Pulse: 70  Temp: 97.6 F (36.4 C)  TempSrc: Oral  SpO2: 99%  Weight: 168 lb (76.2 kg)  Height: 5' 0.5" (1.537 m)    Body mass index is 32.27 kg/m.   Physical Exam Vitals reviewed.  Constitutional:      General: She is not in acute distress.    Appearance: Normal appearance. She is not ill-appearing, toxic-appearing or diaphoretic.  HENT:     Head: Normocephalic.     Right Ear: Tympanic membrane, ear canal and external ear normal. There is no impacted cerumen.     Left Ear: Tympanic membrane, ear canal and external ear normal. There is no impacted cerumen.     Nose: Nose normal.     Mouth/Throat:     Mouth: Mucous membranes are moist.     Pharynx: Oropharynx is clear. No oropharyngeal exudate or posterior oropharyngeal erythema.  Eyes:     General: No scleral icterus.       Right eye: No discharge.        Left eye: No discharge.     Conjunctiva/sclera: Conjunctivae normal.     Pupils: Pupils are equal, round, and reactive to light.  Neck:     Vascular: No carotid bruit.  Cardiovascular:     Rate and Rhythm: Normal rate and regular rhythm.     Pulses: Normal pulses.     Heart sounds: Normal heart sounds.  Pulmonary:     Effort: Pulmonary effort is normal. No respiratory distress.     Breath sounds: Normal breath sounds.  Abdominal:     General: Abdomen is flat. Bowel sounds are normal.     Palpations: Abdomen is soft.  Musculoskeletal:        General: Normal range of motion.     Cervical back: Normal range of motion.  Skin:    General: Skin is warm and dry.  Neurological:     General: No focal deficit present.     Mental Status: She is alert and oriented to person, place, and time. Mental status is at baseline.  Psychiatric:        Mood and Affect: Mood normal.        Behavior:  Behavior normal.        Thought Content: Thought content normal.        Judgment: Judgment normal.     Flowsheet Row Office Visit from 09/30/2023 in [facility] [place]  PHQ-9 Total Score 2        Impression and Plan:  Mixed hyperlipidemia -     Lipid panel; Future  Primary hypertension -     CBC with Differential/Platelet; Future -     Comprehensive metabolic panel; Future  Vitamin D deficiency -     VITAMIN D 25 Hydroxy (Vit-D Deficiency, Fractures); Future  Encounter for preventive health examination  Immunization due  Encounter for hepatitis C screening test for low risk patient -     Hepatitis C antibody; Future  Encounter for screening for HIV -     HIV Antibody (routine testing w rflx); Future  Esophageal dysphagia -     DG ESOPHAGUS W SINGLE CM (SOL OR THIN BA); Future  Morbid obesity (HCC) -     TSH; Future -     Vitamin B12; Future  Screening for colon cancer -     Cologuard   -Recommend routine eye and dental care. -Healthy lifestyle discussed in detail. -Labs to be updated today. -Prostate cancer screening: N/A Health Maintenance  Topic Date Due   HIV Screening  Never done   Hepatitis C Screening  Never done   Colon Cancer Screening  Never done   Flu Shot  05/21/2023   COVID-19 Vaccine (4 - 2023-24 season) 06/21/2023   Mammogram  06/29/2025   DTaP/Tdap/Td vaccine (2 - Td or Tdap) 10/04/2029   Zoster (Shingles) Vaccine  Completed   HPV Vaccine  Aged Out     -Flu vaccine administered in office today. -Advised to update COVID and RSV vaccine at pharmacy. -Cologuard sent today. -Blood pressure is well-controlled. -On atorvastatin 20 mg daily that she takes most days, admits to sometimes forgetting.  Check lipids today. -Check vitamin D given her history of deficiency. -For her dysphagia we will go ahead and order an esophagram.  Consider referral to GI.     Chaya Jan, MD [facility] at  [facility]

## 2023-10-01 LAB — HIV ANTIBODY (ROUTINE TESTING W REFLEX): HIV 1&2 Ab, 4th Generation: NONREACTIVE

## 2023-10-01 LAB — HEPATITIS C ANTIBODY: Hepatitis C Ab: NONREACTIVE

## 2023-10-05 ENCOUNTER — Other Ambulatory Visit: Payer: Self-pay | Admitting: Internal Medicine

## 2023-10-05 DIAGNOSIS — E559 Vitamin D deficiency, unspecified: Secondary | ICD-10-CM

## 2023-10-05 MED ORDER — VITAMIN D (ERGOCALCIFEROL) 1.25 MG (50000 UNIT) PO CAPS: 50000.0000 [IU] | ORAL_CAPSULE | ORAL | 0 refills | Status: AC

## 2023-10-19 DIAGNOSIS — Z1211 Encounter for screening for malignant neoplasm of colon: Secondary | ICD-10-CM | POA: Diagnosis not present

## 2023-10-25 ENCOUNTER — Other Ambulatory Visit: Payer: Self-pay | Admitting: Internal Medicine

## 2023-10-25 LAB — COLOGUARD: COLOGUARD: NEGATIVE

## 2023-11-18 ENCOUNTER — Other Ambulatory Visit: Payer: Self-pay | Admitting: Internal Medicine

## 2023-11-18 DIAGNOSIS — E559 Vitamin D deficiency, unspecified: Secondary | ICD-10-CM

## 2023-12-28 ENCOUNTER — Encounter: Payer: Self-pay | Admitting: Internal Medicine

## 2023-12-28 ENCOUNTER — Other Ambulatory Visit (INDEPENDENT_AMBULATORY_CARE_PROVIDER_SITE_OTHER): Payer: BC Managed Care – PPO

## 2023-12-28 DIAGNOSIS — E559 Vitamin D deficiency, unspecified: Secondary | ICD-10-CM

## 2023-12-28 LAB — VITAMIN D 25 HYDROXY (VIT D DEFICIENCY, FRACTURES): VITD: 57.53 ng/mL (ref 30.00–100.00)

## 2024-02-29 ENCOUNTER — Encounter: Payer: Self-pay | Admitting: Internal Medicine

## 2024-02-29 DIAGNOSIS — Z1283 Encounter for screening for malignant neoplasm of skin: Secondary | ICD-10-CM

## 2024-03-02 DIAGNOSIS — H903 Sensorineural hearing loss, bilateral: Secondary | ICD-10-CM | POA: Diagnosis not present

## 2024-05-06 DIAGNOSIS — H903 Sensorineural hearing loss, bilateral: Secondary | ICD-10-CM | POA: Diagnosis not present

## 2024-06-15 ENCOUNTER — Other Ambulatory Visit: Payer: Self-pay | Admitting: Internal Medicine

## 2024-06-15 DIAGNOSIS — Z1231 Encounter for screening mammogram for malignant neoplasm of breast: Secondary | ICD-10-CM

## 2024-07-01 ENCOUNTER — Other Ambulatory Visit: Payer: Self-pay | Admitting: Internal Medicine

## 2024-07-12 ENCOUNTER — Encounter: Payer: Self-pay | Admitting: Internal Medicine

## 2024-07-12 ENCOUNTER — Ambulatory Visit
Admission: RE | Admit: 2024-07-12 | Discharge: 2024-07-12 | Disposition: A | Discharge status: Home / Self Care | Source: Ambulatory Visit | Attending: Internal Medicine | Admitting: Internal Medicine

## 2024-07-12 DIAGNOSIS — N8110 Cystocele, unspecified: Secondary | ICD-10-CM

## 2024-07-12 DIAGNOSIS — Z1231 Encounter for screening mammogram for malignant neoplasm of breast: Secondary | ICD-10-CM

## 2024-07-25 ENCOUNTER — Encounter: Payer: Self-pay | Admitting: Dermatology

## 2024-07-25 ENCOUNTER — Ambulatory Visit: Admitting: Dermatology

## 2024-07-25 VITALS — BP 126/84 | HR 74

## 2024-07-25 DIAGNOSIS — L578 Other skin changes due to chronic exposure to nonionizing radiation: Secondary | ICD-10-CM | POA: Diagnosis not present

## 2024-07-25 DIAGNOSIS — D225 Melanocytic nevi of trunk: Secondary | ICD-10-CM | POA: Diagnosis not present

## 2024-07-25 DIAGNOSIS — L821 Other seborrheic keratosis: Secondary | ICD-10-CM

## 2024-07-25 DIAGNOSIS — L814 Other melanin hyperpigmentation: Secondary | ICD-10-CM

## 2024-07-25 DIAGNOSIS — D229 Melanocytic nevi, unspecified: Secondary | ICD-10-CM

## 2024-07-25 DIAGNOSIS — D485 Neoplasm of uncertain behavior of skin: Secondary | ICD-10-CM | POA: Diagnosis not present

## 2024-07-25 DIAGNOSIS — Z1283 Encounter for screening for malignant neoplasm of skin: Secondary | ICD-10-CM

## 2024-07-25 DIAGNOSIS — W908XXA Exposure to other nonionizing radiation, initial encounter: Secondary | ICD-10-CM

## 2024-07-25 DIAGNOSIS — D1801 Hemangioma of skin and subcutaneous tissue: Secondary | ICD-10-CM

## 2024-07-25 NOTE — Patient Instructions (Addendum)

## 2024-07-25 NOTE — Progress Notes (Signed)
   New Patient Visit   Subjective  Kayla Fisher is a 63 y.o. female who presents for the following: Skin Cancer Screening and Full Body Skin Exam  The patient presents for Total-Body Skin Exam (TBSE) for skin cancer screening and mole check. The patient has spots, moles and lesions to be evaluated, some may be new or changing.  Pt has no hx of skin cancer, no family hx of MM. Pt has growth on left arm she wants evaluated.  The following portions of the chart were reviewed this encounter and updated as appropriate: medications, allergies, medical history  Review of Systems:  No other skin or systemic complaints except as noted in HPI or Assessment and Plan.  Objective  Well appearing patient in no apparent distress; mood and affect are within normal limits.  A full examination was performed including scalp, head, eyes, ears, nose, lips, neck, chest, axillae, abdomen, back, buttocks, bilateral upper extremities, bilateral lower extremities, hands, feet, fingers, toes, fingernails, and toenails. All findings within normal limits unless otherwise noted below.   Relevant physical exam findings are noted in the Assessment and Plan.  Left Upper Back 1.6 cm mottled brown patch   Assessment & Plan   SKIN CANCER SCREENING PERFORMED TODAY.  ACTINIC DAMAGE - Chronic condition, secondary to cumulative UV/sun exposure - diffuse scaly erythematous macules with underlying dyspigmentation - Recommend daily broad spectrum sunscreen SPF 30+ to sun-exposed areas, reapply every 2 hours as needed.  - Staying in the shade or wearing long sleeves, sun glasses (UVA+UVB protection) and wide brim hats (4-inch brim around the entire circumference of the hat) are also recommended for sun protection.  - Call for new or changing lesions.  MELANOCYTIC NEVI - Tan-brown and/or pink-flesh-colored symmetric macules and papules - Benign appearing on exam today - Observation - Call clinic for new or  changing moles - Recommend daily use of broad spectrum spf 30+ sunscreen to sun-exposed areas.   LENTIGINES Exam: scattered tan macules Due to sun exposure Treatment Plan: Benign-appearing, observe. Recommend daily broad spectrum sunscreen SPF 30+ to sun-exposed areas, reapply every 2 hours as needed.  Call for any changes   HEMANGIOMA Exam: red papule(s) Discussed benign nature. Recommend observation. Call for changes.   SEBORRHEIC KERATOSIS - Stuck-on, waxy, tan-brown papules and/or plaques  - Benign-appearing - Discussed benign etiology and prognosis. - Observe - Call for any changes NEOPLASM OF UNCERTAIN BEHAVIOR OF SKIN Left Upper Back Skin / nail biopsy Type of biopsy: tangential   Informed consent: discussed and consent obtained   Timeout: patient name, date of birth, surgical site, and procedure verified   Procedure prep:  Patient was prepped and draped in usual sterile fashion Prep type:  Chlorhexidine Anesthesia: the lesion was anesthetized in a standard fashion   Anesthetic:  1% lidocaine w/ epinephrine 1-100,000 buffered w/ 8.4% NaHCO3 Instrument used: DermaBlade   Hemostasis achieved with: aluminum chloride   Outcome: patient tolerated procedure well   Post-procedure details: sterile dressing applied and wound care instructions given   Dressing type: bandage and pressure dressing    Specimen 1 - Surgical pathology Differential Diagnosis: R/O MM vs nevus spilus vs other  Check Margins: No  Return in about 1 year (around 07/25/2025) for TBSE.  I, Darice Smock, CMA, am acting as scribe for RUFUS CHRISTELLA HOLY, MD.   Documentation: I have reviewed the above documentation for accuracy and completeness, and I agree with the above.  RUFUS CHRISTELLA HOLY, MD

## 2024-07-27 ENCOUNTER — Ambulatory Visit: Payer: Self-pay | Admitting: Dermatology

## 2024-07-27 ENCOUNTER — Ambulatory Visit: Admitting: Adult Health

## 2024-07-27 ENCOUNTER — Ambulatory Visit: Payer: Self-pay

## 2024-07-27 VITALS — BP 110/80 | HR 75 | Temp 98.1°F | Ht 60.5 in | Wt 123.0 lb

## 2024-07-27 DIAGNOSIS — R634 Abnormal weight loss: Secondary | ICD-10-CM | POA: Diagnosis not present

## 2024-07-27 DIAGNOSIS — M545 Low back pain, unspecified: Secondary | ICD-10-CM

## 2024-07-27 LAB — POCT URINALYSIS DIPSTICK
Bilirubin, UA: NEGATIVE
Blood, UA: POSITIVE
Glucose, UA: NEGATIVE
Ketones, UA: NEGATIVE
Leukocytes, UA: NEGATIVE
Nitrite, UA: NEGATIVE
Protein, UA: NEGATIVE
Spec Grav, UA: 1.01 (ref 1.010–1.025)
Urobilinogen, UA: 0.2 U/dL
pH, UA: 6 (ref 5.0–8.0)

## 2024-07-27 LAB — SURGICAL PATHOLOGY

## 2024-07-27 MED ORDER — CYCLOBENZAPRINE HCL 5 MG PO TABS: 5.0000 mg | ORAL_TABLET | Freq: Three times a day (TID) | ORAL | 0 refills | Status: AC | PRN

## 2024-07-27 NOTE — Telephone Encounter (Signed)
 FYI Only or Action Required?: FYI only for provider.  Patient was last seen in primary care on 09/30/2023 by Kayla Fisher, Tully GRADE, MD.  Called Nurse Triage reporting Back Pain and Flank Pain.  Symptoms began several days ago.  Interventions attempted: OTC medications: ibuprofen and Ice/heat application.  Symptoms are: right flank pain (positional, intermittent in severity), nausea, chills, foamy/bubbly urine gradually worsening.  Triage Disposition: See Physician Within 24 Hours  Patient/caregiver understands and will follow disposition?: Yes           Copied from CRM #[id number]. Topic: Clinical - Red Word Triage >> Jul 27, 2024  8:25 AM Emylou G wrote: [facility] that prompted transfer to Nurse Triage: really bad back pain - nothing she has ever had before. Reason for Disposition  MODERATE pain (e.g., interferes with normal activities or awakens from sleep)  Answer Assessment - Initial Assessment Questions 1. LOCATION: Where does it hurt? (e.g., left, right)     Right side.  2. ONSET: When did the pain start?     Sunday evening. Worsening since Monday night.  3. SEVERITY: How bad is the pain? (e.g., Scale 1-10; mild, moderate, or severe)     7.5-8/10. Pain worsens with movement or standing too long; describes it as a throbbing or spasms.  4. PATTERN: Does the pain come and go, or is it constant?      Comes and goes.  5. CAUSE: What do you think is causing the pain?     Unsure. She denies any injury or obvious cause for pain. She states this feels different than a pulled muscle or arthritis.  6. OTHER SYMPTOMS:  Do you have any other symptoms? (e.g., fever, abdomen pain, vomiting, leg weakness, burning with urination, blood in urine)     Denies burning with urination, blood in urine, brown urine, vomiting, fever. She states her urine has not looked right for 4-5 months, foamy/bubbles, bright yellow; nausea; chills.  7. PREGNANCY:  Is there any chance  you are pregnant? When was your last menstrual period?     N/A.  Ibuprofen and ice, have not helped the pain.  Protocols used: Flank Pain-A-AH

## 2024-07-27 NOTE — Progress Notes (Signed)
 Subjective:    Patient ID: Kayla Fisher, female    DOB: 09/07/1961, 63 y.o.   MRN: [id number]  Flank Pain    63 year old female who  has a past medical history of Gestational diabetes mellitus (GDM), Hypertension, and Morbid obesity (HCC).  She presents to the office today for an acute complaint of right low back pain. She reports that she felt a little strange three days ago, the next morning she woke up and felt fine and went to the gym and did her usual workout, that evening she developed worsening pain to her right low back that radiates down to her buttock. Yesterday the pain was really bad and she could not leave the house. Today the pain is slightly better. She took OTC motrin yesterday and then earlier this morning. She may have felt a spasm. She has noticed that she has to lay in her bed with her left knee up to provide relief.   She denies UTI symptoms or history of kidney stone   Review of Systems  Genitourinary:  Positive for flank pain.   See HPI   Past Medical History:  Diagnosis Date   Gestational diabetes mellitus (GDM)    Hypertension    Morbid obesity (HCC)     Social History   Socioeconomic History   Marital status: Married    Spouse name: Not on file   Number of children: Not on file   Years of education: Not on file   Highest education level: GED or equivalent  Occupational History   Not on file  Tobacco Use   Smoking status: Never   Smokeless tobacco: Never  Substance and Sexual Activity   Alcohol use: Yes    Comment: occasional   Drug use: Never   Sexual activity: Not on file  Other Topics Concern   Not on file  Social History Narrative   Retired.    Social Drivers of Corporate investment banker Strain: Low Risk  (09/28/2023)   Overall Financial Resource Strain (CARDIA)    Difficulty of Paying Living Expenses: Not hard at all  Food Insecurity: No Food Insecurity (09/28/2023)   Hunger Vital Sign    Worried About Running Out of  Food in the Last Year: Never true    Ran Out of Food in the Last Year: Never true  Transportation Needs: No Transportation Needs (09/28/2023)   PRAPARE - Administrator, Civil Service (Medical): No    Lack of Transportation (Non-Medical): No  Physical Activity: Sufficiently Active (09/28/2023)   Exercise Vital Sign    Days of Exercise per Week: 4 days    Minutes of Exercise per Session: 40 min  Stress: No Stress Concern Present (09/28/2023)   [employer] of Occupational Health - Occupational Stress Questionnaire    Feeling of Stress : Only a little  Social Connections: Moderately Isolated (09/28/2023)   Social Connection and Isolation Panel    Frequency of Communication with Friends and Family: More than three times a week    Frequency of Social Gatherings with Friends and Family: Patient declined    Attends Religious Services: Never    Database administrator or Organizations: No    Attends Engineer, structural: Not on file    Marital Status: Married  Catering manager Violence: Not on file    Past Surgical History:  Procedure Laterality Date   ANTERIOR (CYSTOCELE) AND POSTERIOR REPAIR (RECTOCELE) WITH XENFORM GRAFT AND SACROSPINOUS FIXATION  CHOLECYSTECTOMY     TOTAL ABDOMINAL HYSTERECTOMY      Family History  Problem Relation Age of Onset   Ovarian cancer Mother    Diabetes Father    Yvone' disease Father    Atrial fibrillation Father    Peripheral Artery Disease Father    Ovarian cancer Sister    Sick sinus syndrome Sister    Breast cancer Cousin     No Known Allergies  Current Outpatient Medications on File Prior to Visit  Medication Sig Dispense Refill   albuterol  (VENTOLIN  HFA) 108 (90 Base) MCG/ACT inhaler Inhale 2 puffs into the lungs every 6 (six) hours as needed for wheezing or shortness of breath. 8 g 2   atorvastatin  (LIPITOR) 20 MG tablet TAKE 1 TABLET BY MOUTH EVERY DAY 30 tablet 2   Multiple Vitamin (MULTIVITAMIN) tablet Take 1  tablet by mouth daily.     No current facility-administered medications on file prior to visit.    BP 110/80   Pulse 75   Temp 98.1 F (36.7 C) (Oral)   Ht 5' 0.5 (1.537 m)   Wt 123 lb (55.8 kg)   SpO2 96%   BMI 23.63 kg/m       Objective:   Physical Exam Vitals and nursing note reviewed.  Constitutional:      Appearance: Normal appearance.  Musculoskeletal:        General: Tenderness present.     Lumbar back: Tenderness present. No swelling, deformity, spasms or bony tenderness. Decreased range of motion. Negative right straight leg raise test and negative left straight leg raise test.       Back:  Skin:    General: Skin is warm and dry.     Capillary Refill: Capillary refill takes less than 2 seconds.  Neurological:     General: No focal deficit present.     Mental Status: She is alert and oriented to person, place, and time.  Psychiatric:        Mood and Affect: Mood normal.        Behavior: Behavior normal.        Thought Content: Thought content normal.        Judgment: Judgment normal.           Assessment & Plan:  1. Acute right-sided low back pain without sciatica (Primary)  - POC Urinalysis Dipstick - negative for infection, some mild microscopic hematuria  - Pain seems to be related to muscle strain. She did have tight right sided paraspinal muscles. - Will send in Flexeril. Advised heat and she was wondering about using a TENS unit - this should be fine  - Follow up if not improving in the next 3-4 days  - cyclobenzaprine (FLEXERIL) 5 MG tablet; Take 1 tablet (5 mg total) by mouth 3 (three) times daily as needed for muscle spasms.  Dispense: 15 tablet; Refill: 0  2. Weight loss - it was noted that she has had 45 pound weight loss since she was last seen about 10 months ago. She reports that this has been intentional to help with pain from osteoarthritis.   Wt Readings from Last 3 Encounters:  07/27/24 123 lb (55.8 kg)  09/30/23 168 lb (76.2 kg)   04/28/23 178 lb 3.2 oz (80.8 kg)     Darleene Shape, NP  I personally spent a total of 31 minutes in the care of the patient today including preparing to see the patient, performing a medically appropriate exam/evaluation, counseling and educating, placing  orders, and documenting clinical information in the EHR.

## 2024-07-27 NOTE — Telephone Encounter (Signed)
 noted

## 2024-08-01 ENCOUNTER — Encounter: Payer: Self-pay | Admitting: Internal Medicine

## 2024-08-01 DIAGNOSIS — Z87448 Personal history of other diseases of urinary system: Secondary | ICD-10-CM

## 2024-08-01 DIAGNOSIS — Z124 Encounter for screening for malignant neoplasm of cervix: Secondary | ICD-10-CM

## 2024-09-09 ENCOUNTER — Other Ambulatory Visit: Payer: Self-pay | Admitting: Internal Medicine

## 2024-09-28 DIAGNOSIS — N8110 Cystocele, unspecified: Secondary | ICD-10-CM | POA: Diagnosis not present

## 2024-09-28 DIAGNOSIS — Z01419 Encounter for gynecological examination (general) (routine) without abnormal findings: Secondary | ICD-10-CM | POA: Diagnosis not present

## 2024-09-28 DIAGNOSIS — Z01411 Encounter for gynecological examination (general) (routine) with abnormal findings: Secondary | ICD-10-CM | POA: Diagnosis not present

## 2024-09-28 DIAGNOSIS — Z130 Encounter for screening for diseases of the blood and blood-forming organs and certain disorders involving the immune mechanism: Secondary | ICD-10-CM | POA: Diagnosis not present

## 2024-09-28 DIAGNOSIS — N8111 Cystocele, midline: Secondary | ICD-10-CM | POA: Diagnosis not present

## 2024-09-28 DIAGNOSIS — R3129 Other microscopic hematuria: Secondary | ICD-10-CM | POA: Diagnosis not present

## 2025-07-25 ENCOUNTER — Ambulatory Visit: Admitting: Dermatology
